# Patient Record
Sex: Female | Born: 1955 | Race: White | Hispanic: No | Marital: Married | Smoking: Former smoker
Health system: Southern US, Community
[De-identification: ages and names within clinical notes are randomized; demographics above are authoritative.]

## PROBLEM LIST (undated history)

## (undated) DIAGNOSIS — I471 Supraventricular tachycardia, unspecified: Secondary | ICD-10-CM

## (undated) DIAGNOSIS — I10 Essential (primary) hypertension: Secondary | ICD-10-CM

## (undated) DIAGNOSIS — E119 Type 2 diabetes mellitus without complications: Secondary | ICD-10-CM

## (undated) DIAGNOSIS — I639 Cerebral infarction, unspecified: Secondary | ICD-10-CM

## (undated) DIAGNOSIS — E7800 Pure hypercholesterolemia, unspecified: Secondary | ICD-10-CM

## (undated) HISTORY — PX: TRIGGER FINGER RELEASE: SHX641

## (undated) HISTORY — PX: CHOLECYSTECTOMY: SHX55

---

## 2007-05-16 ENCOUNTER — Inpatient Hospital Stay: Payer: Self-pay | Admitting: Internal Medicine

## 2007-05-16 ENCOUNTER — Other Ambulatory Visit: Payer: Self-pay

## 2007-05-21 ENCOUNTER — Ambulatory Visit: Payer: Self-pay | Admitting: Internal Medicine

## 2007-05-27 ENCOUNTER — Encounter: Payer: Self-pay | Admitting: Internal Medicine

## 2007-06-11 ENCOUNTER — Encounter: Payer: Self-pay | Admitting: Internal Medicine

## 2007-06-11 ENCOUNTER — Ambulatory Visit: Payer: Self-pay | Admitting: Internal Medicine

## 2007-07-12 ENCOUNTER — Encounter: Payer: Self-pay | Admitting: Internal Medicine

## 2008-06-23 ENCOUNTER — Ambulatory Visit: Payer: Self-pay | Admitting: Internal Medicine

## 2009-11-09 ENCOUNTER — Ambulatory Visit: Payer: Self-pay | Admitting: Internal Medicine

## 2011-01-10 ENCOUNTER — Ambulatory Visit: Payer: Self-pay

## 2013-12-08 ENCOUNTER — Ambulatory Visit: Payer: Self-pay | Admitting: Orthopedic Surgery

## 2014-06-21 ENCOUNTER — Ambulatory Visit: Payer: Self-pay | Admitting: Gastroenterology

## 2014-06-23 LAB — PATHOLOGY REPORT

## 2016-06-07 ENCOUNTER — Other Ambulatory Visit: Payer: Self-pay | Admitting: Obstetrics and Gynecology

## 2016-06-07 DIAGNOSIS — Z1231 Encounter for screening mammogram for malignant neoplasm of breast: Secondary | ICD-10-CM

## 2016-07-05 ENCOUNTER — Encounter: Payer: Self-pay | Admitting: Radiology

## 2016-07-05 ENCOUNTER — Ambulatory Visit
Admission: RE | Admit: 2016-07-05 | Discharge: 2016-07-05 | Disposition: A | Discharge status: Home / Self Care | Payer: Managed Care, Other (non HMO) | Source: Ambulatory Visit | Attending: Obstetrics and Gynecology | Admitting: Obstetrics and Gynecology

## 2016-07-05 DIAGNOSIS — Z1231 Encounter for screening mammogram for malignant neoplasm of breast: Secondary | ICD-10-CM | POA: Diagnosis not present

## 2017-07-24 ENCOUNTER — Other Ambulatory Visit: Payer: Self-pay | Admitting: Obstetrics and Gynecology

## 2017-07-24 DIAGNOSIS — Z1231 Encounter for screening mammogram for malignant neoplasm of breast: Secondary | ICD-10-CM

## 2017-08-22 ENCOUNTER — Ambulatory Visit
Admission: RE | Admit: 2017-08-22 | Discharge: 2017-08-22 | Disposition: A | Discharge status: Home / Self Care | Payer: Commercial Managed Care - PPO | Source: Ambulatory Visit | Attending: Obstetrics and Gynecology | Admitting: Obstetrics and Gynecology

## 2017-08-22 DIAGNOSIS — Z1231 Encounter for screening mammogram for malignant neoplasm of breast: Secondary | ICD-10-CM | POA: Insufficient documentation

## 2018-01-12 ENCOUNTER — Encounter: Payer: Self-pay | Admitting: Gynecology

## 2018-01-12 ENCOUNTER — Other Ambulatory Visit: Payer: Self-pay

## 2018-01-12 ENCOUNTER — Ambulatory Visit
Admission: EM | Admit: 2018-01-12 | Discharge: 2018-01-12 | Disposition: A | Discharge status: Home / Self Care | Payer: Commercial Managed Care - PPO | Attending: Family Medicine | Admitting: Family Medicine

## 2018-01-12 ENCOUNTER — Ambulatory Visit (INDEPENDENT_AMBULATORY_CARE_PROVIDER_SITE_OTHER): Payer: Commercial Managed Care - PPO

## 2018-01-12 DIAGNOSIS — M25532 Pain in left wrist: Secondary | ICD-10-CM | POA: Diagnosis not present

## 2018-01-12 HISTORY — DX: Essential (primary) hypertension: I10

## 2018-01-12 HISTORY — DX: Pure hypercholesterolemia, unspecified: E78.00

## 2018-01-12 HISTORY — DX: Type 2 diabetes mellitus without complications: E11.9

## 2018-01-12 MED ORDER — PREDNISONE 10 MG PO TABS: ORAL_TABLET | ORAL | 0 refills | Status: DC

## 2018-01-12 MED ORDER — OXYCODONE-ACETAMINOPHEN 5-325 MG PO TABS: 1.0000 | ORAL_TABLET | Freq: Four times a day (QID) | ORAL | 0 refills | Status: DC | PRN

## 2018-01-12 NOTE — Discharge Instructions (Signed)
Take medication as prescribed. Rest. Drink plenty of fluids.   Follow up with orthopedic this week as needed.  As discussed for any increased pain, pain radiation or decreased range of motion or worsening concerns proceed directly to the emergency department.

## 2018-01-12 NOTE — ED Triage Notes (Signed)
Patient c/o left hand pain x last pm. Per patient deny any injury to her left hand.

## 2018-01-12 NOTE — ED Provider Notes (Addendum)
[facility] ____________________________________________  Time seen: Approximately 1:27 PM  I have reviewed the triage vital signs and the nursing notes.   HISTORY  Chief Complaint Hand Pain   HPI Ximenna Fonseca is a 62 y.o. female presenting for evaluation of left wrist pain.  Patient reports pain started yesterday evening, and states it felt like a normal arthritis aching pain.  Reports pain has gradually increased, particularly over the last few hours today.  States pain is based out of the left wrist and does somewhat radiate to her hand as well as distal forearm.  Denies any further radiation of pain.  Denies any known trauma or direct injury.  Reports right-hand dominant.  Reports movement of left hand and wrist does increase pain, but pain is present as well at rest.  Reports that she does have some slight intermittent tingling sensation to left hand distal fingers that is been present since trigger finger release last year, but denies any sensation changes.  States she does still have full range of motion present but with a lot of pain to do so to left wrist.  Denies any skin changes, insect bite, swelling, loss of sensation, or other complaints.  Denies known triggers.  Denies history of same in the past.  Reports otherwise feels well.  Denies any chest pain, shortness of breath, paresthesias, headache, fevers, cough, congestion, recent sickness or other complaints.  Did take one Mobic earlier this morning, and feels like that may have started to help pain decrease some.  States moderate pain at this time.  Denies other relieving factors.  Follows with Dr Rudene Christians ortho  Past Medical History:  Diagnosis Date  . Diabetes mellitus without complication ([place])   . Hypercholesteremia   . Hypertension     There are no active problems to display for this patient.   Past Surgical History:  Procedure Laterality Date  . CHOLECYSTECTOMY    . TRIGGER FINGER RELEASE       No  current facility-administered medications for this encounter.   Current Outpatient Medications:  .  amLODipine (NORVASC) 5 MG tablet, Take by mouth., Disp: , Rfl:  .  atorvastatin (LIPITOR) 10 MG tablet, Take by mouth., Disp: , Rfl:  .  metFORMIN (GLUCOPHAGE) 500 MG tablet, Take by mouth., Disp: , Rfl:  .  Prasterone (INTRAROSA) 6.5 MG INST, Place vaginally., Disp: , Rfl:  .  enalapril (VASOTEC) 5 MG tablet, , Disp: , Rfl: 1 .  ibuprofen (ADVIL,MOTRIN) 200 MG tablet, Take 1-2 tablets by mouth three times a day as needed for pain., Disp: , Rfl:  .  INTRAROSA 6.5 MG INST, , Disp: , Rfl: 11 .  oxyCODONE-acetaminophen (PERCOCET/ROXICET) 5-325 MG tablet, Take 1 tablet by mouth every 6 (six) hours as needed for severe pain. Do not drive while taking as can cause drowsiness., Disp: 5 tablet, Rfl: 0 .  predniSONE (DELTASONE) 10 MG tablet, Start 60 mg po day one, then 50 mg po day two, taper by 10 mg daily until complete., Disp: 21 tablet, Rfl: 0  Allergies Codeine  Family History  Problem Relation Age of Onset  . Hypertension Mother   . Diabetes Mother   . Hypercholesterolemia Mother   . Heart failure Father     Social History Social History   Tobacco Use  . Smoking status: Former Research scientist (life sciences)  . Smokeless tobacco: Never Used  Substance Use Topics  . Alcohol use: Yes  . Drug use: Never    Review of Systems Constitutional: No fever/chills  Cardiovascular: Denies chest pain. Respiratory: Denies shortness of breath. Gastrointestinal: No abdominal pain.  Musculoskeletal: Negative for back pain. As above.  Skin: Negative for rash. Neurological: Negative for headaches, focal weakness or numbness.    ____________________________________________   PHYSICAL EXAM:  VITAL SIGNS: ED Triage Vitals  Enc Vitals Group     BP 01/12/18 1209 (!) 159/87     Pulse Rate 01/12/18 1209 100     Resp 01/12/18 1209 16     Temp 01/12/18 1209 98.7 F (37.1 C)     Temp Source 01/12/18 1209 Oral      SpO2 01/12/18 1209 98 %     Weight 01/12/18 1203 147 lb (66.7 kg)     Height 01/12/18 1203 5\' 6"  (1.676 m)     Head Circumference --      Peak Flow --      Pain Score 01/12/18 1203 7     Pain Loc --      Pain Edu? --      Excl. in [place]? --     Constitutional: Alert and oriented. Well appearing and in no acute distress. ENT      Head: Normocephalic and atraumatic. Cardiovascular: Normal rate, regular rhythm. Grossly normal heart sounds.  Good peripheral circulation. Respiratory: Normal respiratory effort without tachypnea nor retractions. Breath sounds are clear and equal bilaterally. No wheezes, rales, rhonchi. Musculoskeletal: No midline cervical, thoracic or lumbar tenderness to palpation. Bilateral distal radial and brachial pulses equal and easily palpated. Except: Left dorsal and volar wrist diffuse but localized in wrist moderate tenderness to direct palpation, with minimal tenderness to proximal metacarpals and distal forearm of left arm, no edema, no erythema, normal coloration, left hand normal distal sensation per patient, and brisk distal capillary refill, no motor or tendon deficit noted to left distal hand, weakened left hand grip compared to right, increased pain with left wrist flexion and extension as well as pronation and supination, left upper extremity otherwise nontender. Neurologic:  Normal speech and language. No gross focal neurologic deficits are appreciated. Speech is normal. No gait instability.  Skin:  Skin is warm, dry and intact. No rash noted. Psychiatric: Mood and affect are normal. Speech and behavior are normal. Patient exhibits appropriate insight and judgment   ___________________________________________   LABS (all labs ordered are listed, but only abnormal results are displayed)  Labs Reviewed - No data to display  RADIOLOGY  Dg Wrist Complete Left  Result Date: 01/12/2018 CLINICAL DATA:  Acute nontraumatic diffuse left wrist pain. EXAM: LEFT WRIST -  COMPLETE 3+ VIEW COMPARISON:  None. FINDINGS: There is no evidence of fracture or dislocation. Coarse calcifications within the radiocarpal joint may represent posttraumatic changes or CPPD arthropathy. Diffuse soft tissue swelling of the wrist noted. IMPRESSION: Posttraumatic changes versus CPPD arthropathy of the radiocarpal joint. Diffuse soft tissue swelling. No evidence of fracture or dislocation. Electronically Signed   By: Fidela Salisbury M.D.   On: 01/12/2018 13:38   ____________________________________________   PROCEDURES Procedures    INITIAL IMPRESSION / ASSESSMENT AND PLAN / ED COURSE  Pertinent labs & imaging results that were available during my care of the patient were reviewed by me and considered in my medical decision making (see chart for details).  Overall well-appearing patient.  Presented for evaluation of left wrist pain.  Denies known trauma.  Diffuse left wrist pain, normal distal sensation capillary refill, distal pulses intact.  No skin changes or edema.  Left wrist x-ray evaluated, results as above per radiologist.  Per radiologist left wrist post traumatic changes versus CPPD arthropathy, no fracture or dislocation.  Patient denies previous trauma to left wrist.  Suspect acute pseudogout.  Discussed will treat with oral prednisone, discussed monitoring blood sugars and PRN Percocet as needed for breakthrough pain.  Velcro cock-up splint given for support.  Discussed for any increased pain or worsening concerns, decreased range of motion or sensation changes proceed directly to the emergency room for further evaluation.  Follow-up with orthopedic this week.Discussed indication, risks and benefits of medications with patient.  Discussed follow up with Primary care physician this week. Discussed follow up and return parameters including no resolution or any worsening concerns. Patient verbalized understanding and agreed to plan.   Discussed patient and plan of care  with Dr. Zenda Alpers who agrees with plan.[place] controlled substance database reviewed and no recent controlled medications documented.  ____________________________________________   FINAL CLINICAL IMPRESSION(S) / ED DIAGNOSES  Final diagnoses:  Left wrist pain     ED Discharge Orders        Ordered    predniSONE (DELTASONE) 10 MG tablet     01/12/18 1401    oxyCODONE-acetaminophen (PERCOCET/ROXICET) 5-325 MG tablet  Every 6 hours PRN     01/12/18 1401       Note: This dictation was prepared with Dragon dictation along with smaller phrase technology. Any transcriptional errors that result from this process are unintentional.           Marylene Land, NP 01/12/18 1746

## 2018-03-28 ENCOUNTER — Encounter: Payer: Self-pay | Admitting: *Deleted

## 2018-03-31 ENCOUNTER — Ambulatory Visit: Payer: Commercial Managed Care - PPO | Admitting: Anesthesiology

## 2018-03-31 ENCOUNTER — Ambulatory Visit
Admission: RE | Admit: 2018-03-31 | Discharge: 2018-03-31 | Disposition: A | Discharge status: Home / Self Care | Payer: Commercial Managed Care - PPO | Source: Ambulatory Visit | Attending: Unknown Physician Specialty | Admitting: Unknown Physician Specialty

## 2018-03-31 ENCOUNTER — Encounter
Admission: RE | Disposition: A | Discharge status: Home / Self Care | Payer: Self-pay | Source: Ambulatory Visit | Attending: Unknown Physician Specialty

## 2018-03-31 ENCOUNTER — Encounter: Payer: Self-pay | Admitting: Anesthesiology

## 2018-03-31 DIAGNOSIS — I10 Essential (primary) hypertension: Secondary | ICD-10-CM | POA: Diagnosis not present

## 2018-03-31 DIAGNOSIS — E7800 Pure hypercholesterolemia, unspecified: Secondary | ICD-10-CM | POA: Diagnosis not present

## 2018-03-31 DIAGNOSIS — Z87891 Personal history of nicotine dependence: Secondary | ICD-10-CM | POA: Diagnosis not present

## 2018-03-31 DIAGNOSIS — Z79899 Other long term (current) drug therapy: Secondary | ICD-10-CM | POA: Diagnosis not present

## 2018-03-31 DIAGNOSIS — Z885 Allergy status to narcotic agent status: Secondary | ICD-10-CM | POA: Insufficient documentation

## 2018-03-31 DIAGNOSIS — I471 Supraventricular tachycardia: Secondary | ICD-10-CM | POA: Insufficient documentation

## 2018-03-31 DIAGNOSIS — Z8371 Family history of colonic polyps: Secondary | ICD-10-CM | POA: Diagnosis not present

## 2018-03-31 DIAGNOSIS — K640 First degree hemorrhoids: Secondary | ICD-10-CM | POA: Insufficient documentation

## 2018-03-31 DIAGNOSIS — Z7982 Long term (current) use of aspirin: Secondary | ICD-10-CM | POA: Insufficient documentation

## 2018-03-31 DIAGNOSIS — Z8673 Personal history of transient ischemic attack (TIA), and cerebral infarction without residual deficits: Secondary | ICD-10-CM | POA: Diagnosis not present

## 2018-03-31 DIAGNOSIS — Z86010 Personal history of colonic polyps: Secondary | ICD-10-CM | POA: Diagnosis not present

## 2018-03-31 DIAGNOSIS — E119 Type 2 diabetes mellitus without complications: Secondary | ICD-10-CM | POA: Diagnosis not present

## 2018-03-31 DIAGNOSIS — Z7984 Long term (current) use of oral hypoglycemic drugs: Secondary | ICD-10-CM | POA: Diagnosis not present

## 2018-03-31 DIAGNOSIS — D127 Benign neoplasm of rectosigmoid junction: Secondary | ICD-10-CM | POA: Diagnosis not present

## 2018-03-31 DIAGNOSIS — Z1211 Encounter for screening for malignant neoplasm of colon: Secondary | ICD-10-CM | POA: Insufficient documentation

## 2018-03-31 HISTORY — DX: Supraventricular tachycardia: I47.1

## 2018-03-31 HISTORY — DX: Cerebral infarction, unspecified: I63.9

## 2018-03-31 HISTORY — DX: Supraventricular tachycardia, unspecified: I47.10

## 2018-03-31 HISTORY — PX: COLONOSCOPY WITH PROPOFOL: SHX5780

## 2018-03-31 LAB — GLUCOSE, CAPILLARY: Glucose-Capillary: 141 mg/dL — ABNORMAL HIGH (ref 70–99)

## 2018-03-31 SURGERY — COLONOSCOPY WITH PROPOFOL
Anesthesia: General

## 2018-03-31 MED ORDER — PROPOFOL 500 MG/50ML IV EMUL
INTRAVENOUS | Status: DC | PRN
  Administered 2018-03-31: 110 ug/kg/min via INTRAVENOUS

## 2018-03-31 MED ORDER — SODIUM CHLORIDE 0.9 % IV SOLN
INTRAVENOUS | Status: DC
  Administered 2018-03-31: 11:00:00 via INTRAVENOUS

## 2018-03-31 MED ORDER — PROPOFOL 500 MG/50ML IV EMUL
INTRAVENOUS | Status: AC
  Filled 2018-03-31: qty 50

## 2018-03-31 MED ORDER — PROPOFOL 10 MG/ML IV BOLUS
INTRAVENOUS | Status: DC | PRN
  Administered 2018-03-31: 100 mg via INTRAVENOUS

## 2018-03-31 MED ORDER — SODIUM CHLORIDE 0.9 % IV SOLN: INTRAVENOUS | Status: DC

## 2018-03-31 NOTE — Transfer of Care (Signed)
Immediate Anesthesia Transfer of Care Note  Patient: Monica Nixon  Procedure(s) Performed: COLONOSCOPY WITH PROPOFOL (N/A )  Patient Location: PACU  Anesthesia Type:General  Level of Consciousness: awake, alert  and oriented  Airway & Oxygen Therapy: Patient Spontanous Breathing and Patient connected to nasal cannula oxygen  Post-op Assessment: Report given to RN and Post -op Vital signs reviewed and stable  Post vital signs: Reviewed and stable  Last Vitals:  Vitals Value Taken Time  BP 97/72 03/31/2018 12:10 PM  Temp 36.6 C 03/31/2018 12:10 PM  Pulse 86 03/31/2018 12:10 PM  Resp 10 03/31/2018 12:10 PM  SpO2 100 % 03/31/2018 12:10 PM    Last Pain:  Vitals:   03/31/18 1210  TempSrc: Tympanic  PainSc: 0-No pain         Complications: No apparent anesthesia complications

## 2018-03-31 NOTE — Op Note (Signed)
[facility] Gastroenterology Patient Name: Monica Nixon Procedure Date: 03/31/2018 11:37 AM MRN: [id number] Account #: [id number] Date of Birth: [birth year] Admit Type: Outpatient Age: 63 Room: [facility] ENDO ROOM 1 Gender: Female Note Status: Finalized Procedure:            Colonoscopy Indications:          Screening for colorectal malignant neoplasm Providers:            Manya Silvas, MD Referring MD:         No Local Md, MD (Referring MD) Medicines:            Propofol per Anesthesia Complications:        No immediate complications. Procedure:            Pre-Anesthesia Assessment:                       - After reviewing the risks and benefits, the patient                        was deemed in satisfactory condition to undergo the                        procedure.                       After obtaining informed consent, the colonoscope was                        passed under direct vision. Throughout the procedure,                        the patient's blood pressure, pulse, and oxygen                        saturations were monitored continuously. The                        Colonoscope was introduced through the anus and                        advanced to the the cecum, identified by appendiceal                        orifice and ileocecal valve. The colonoscopy was                        performed without difficulty. The patient tolerated the                        procedure well. The quality of the bowel preparation                        was good. Findings:      A fair amount of liquid stool had to be removed to get a good exam.      A small polyp was found in the recto-sigmoid colon. The polyp was       sessile. The polyp was removed with a hot snare. Resection and retrieval       were complete.      Internal hemorrhoids were found during endoscopy. The hemorrhoids were  small and Grade I (internal hemorrhoids that do not prolapse).      The exam was  otherwise without abnormality. Impression:           - One small polyp at the recto-sigmoid colon, removed                        with a hot snare. Resected and retrieved.                       - Internal hemorrhoids.                       - The examination was otherwise normal. Recommendation:       - Await pathology results. Manya Silvas, MD 03/31/2018 12:10:45 PM This report has been signed electronically. Number of Addenda: 0 Note Initiated On: 03/31/2018 11:37 AM Scope Withdrawal Time: 0 hours 15 minutes 38 seconds  Total Procedure Duration: 0 hours 23 minutes 53 seconds       [facility]

## 2018-03-31 NOTE — H&P (Signed)
Primary Care Physician:  System, Pcp Not In Primary Gastroenterologist:  Dr. Vira Agar  Pre-Procedure History & Physical: HPI:  Monica Nixon is a 62 y.o. female is here for an colonoscopy.  For Personal history of colon polyps and family history of colon polyps.   Past Medical History:  Diagnosis Date  . Diabetes mellitus without complication ([place])   . Hypercholesteremia   . Hypertension   . Stroke ([place])   . SVT (supraventricular tachycardia) ([facility])     Past Surgical History:  Procedure Laterality Date  . CHOLECYSTECTOMY    . TRIGGER FINGER RELEASE      Prior to Admission medications   Medication Sig Start Date End Date Taking? Authorizing Provider  amLODipine (NORVASC) 5 MG tablet Take by mouth. 07/24/17  Yes [provider]  aspirin 81 MG chewable tablet Chew by mouth daily.   Yes [provider]  atorvastatin (LIPITOR) 10 MG tablet Take by mouth. 11/14/17 11/14/18 Yes [provider]  enalapril (VASOTEC) 5 MG tablet  12/09/17  Yes [provider]  INTRAROSA 6.5 MG INST  12/10/17  Yes [provider]  metFORMIN (GLUCOPHAGE) 500 MG tablet Take by mouth. 11/14/17 11/14/18 Yes [provider]  Prasterone (INTRAROSA) 6.5 MG INST Place vaginally. 07/24/17  Yes [provider]  ibuprofen (ADVIL,MOTRIN) 200 MG tablet Take 1-2 tablets by mouth three times a day as needed for pain.    [provider]  oxyCODONE-acetaminophen (PERCOCET/ROXICET) 5-325 MG tablet Take 1 tablet by mouth every 6 (six) hours as needed for severe pain. Do not drive while taking as can cause drowsiness. Patient not taking: Reported on 03/31/2018 01/12/18   Marylene Land, NP  predniSONE (DELTASONE) 10 MG tablet Start 60 mg po day one, then 50 mg po day two, taper by 10 mg daily until complete. Patient not taking: Reported on 03/31/2018 01/12/18   Marylene Land, NP    Allergies as of 01/22/2018 - Review Complete 01/12/2018  Allergen Reaction Noted  .  Codeine Nausea And Vomiting 03/25/2014    Family History  Problem Relation Age of Onset  . Hypertension Mother   . Diabetes Mother   . Hypercholesterolemia Mother   . Heart failure Father     Social History   Socioeconomic History  . Marital status: Married    Spouse name: Not on file  . Number of children: Not on file  . Years of education: Not on file  . Highest education level: Not on file  Occupational History  . Not on file  Social Needs  . Financial resource strain: Not on file  . Food insecurity:    Worry: Not on file    Inability: Not on file  . Transportation needs:    Medical: Not on file    Non-medical: Not on file  Tobacco Use  . Smoking status: Former Research scientist (life sciences)  . Smokeless tobacco: Never Used  Substance and Sexual Activity  . Alcohol use: Yes  . Drug use: Never  . Sexual activity: Not on file  Lifestyle  . Physical activity:    Days per week: Not on file    Minutes per session: Not on file  . Stress: Not on file  Relationships  . Social connections:    Talks on phone: Not on file    Gets together: Not on file    Attends religious service: Not on file    Active member of club or organization: Not on file    Attends meetings of clubs or  organizations: Not on file    Relationship status: Not on file  . Intimate partner violence:    Fear of current or ex partner: Not on file    Emotionally abused: Not on file    Physically abused: Not on file    Forced sexual activity: Not on file  Other Topics Concern  . Not on file  Social History Narrative  . Not on file    Review of Systems: See HPI, otherwise negative ROS  Physical Exam: BP 120/85   Pulse (!) 117   Temp (!) 96 F (35.6 C) (Tympanic)   Resp 18   Ht 5\' 6"  (1.676 m)   Wt 63.5 kg (140 lb)   SpO2 98%   BMI 22.60 kg/m  General:   Alert,  pleasant and cooperative in NAD Head:  Normocephalic and atraumatic. Neck:  Supple; no masses or thyromegaly. Lungs:  Clear throughout to  auscultation.    Heart:  Regular rate and rhythm. Abdomen:  Soft, nontender and nondistended. Normal bowel sounds, without guarding, and without rebound.   Neurologic:  Alert and  oriented x4;  grossly normal neurologically.  Impression/Plan: Monica Nixon is here for an colonoscopy to be performed for Personal history of colon polyps and family history of colon polyps.  Risks, benefits, limitations, and alternatives regarding  colonoscopy have been reviewed with the patient.  Questions have been answered.  All parties agreeable.   Gaylyn Cheers, MD  03/31/2018, 11:34 AM

## 2018-03-31 NOTE — Anesthesia Preprocedure Evaluation (Addendum)
Anesthesia Evaluation  Patient identified by MRN, date of birth, ID band Patient awake    Reviewed: Allergy & Precautions, NPO status , Patient's Chart, lab work & pertinent test results  Airway Mallampati: III       Dental   Pulmonary former smoker,    Pulmonary exam normal        Cardiovascular hypertension, Normal cardiovascular exam+ dysrhythmias Supra Ventricular Tachycardia      Neuro/Psych CVA negative psych ROS   GI/Hepatic negative GI ROS, Neg liver ROS,   Endo/Other  diabetes  Renal/GU   negative genitourinary   Musculoskeletal negative musculoskeletal ROS (+)   Abdominal Normal abdominal exam  (+)   Peds negative pediatric ROS (+)  Hematology negative hematology ROS (+)   Anesthesia Other Findings   Reproductive/Obstetrics                            Anesthesia Physical Anesthesia Plan  ASA: III  Anesthesia Plan: General   Post-op Pain Management:    Induction: Intravenous  PONV Risk Score and Plan:   Airway Management Planned: Nasal Cannula  Additional Equipment:   Intra-op Plan:   Post-operative Plan:   Informed Consent: I have reviewed the patients History and Physical, chart, labs and discussed the procedure including the risks, benefits and alternatives for the proposed anesthesia with the patient or authorized representative who has indicated his/her understanding and acceptance.   Dental advisory given  Plan Discussed with: CRNA and Surgeon  Anesthesia Plan Comments:         Anesthesia Quick Evaluation

## 2018-03-31 NOTE — Anesthesia Post-op Follow-up Note (Signed)
Anesthesia QCDR form completed.        

## 2018-04-01 ENCOUNTER — Encounter: Payer: Self-pay | Admitting: Unknown Physician Specialty

## 2018-04-01 LAB — SURGICAL PATHOLOGY

## 2018-04-01 NOTE — Anesthesia Postprocedure Evaluation (Signed)
Anesthesia Post Note  Patient: Monica Nixon  Procedure(s) Performed: COLONOSCOPY WITH PROPOFOL (N/A )  Patient location during evaluation: PACU Anesthesia Type: General Level of consciousness: awake and alert and oriented Pain management: pain level controlled Vital Signs Assessment: post-procedure vital signs reviewed and stable Respiratory status: spontaneous breathing Cardiovascular status: blood pressure returned to baseline Anesthetic complications: no     Last Vitals:  Vitals:   03/31/18 1230 03/31/18 1240  BP: 124/68 134/70  Pulse: 77 70  Resp: 19 14  Temp:    SpO2: 100% 100%    Last Pain:  Vitals:   04/01/18 0741  TempSrc:   PainSc: 0-No pain                 [name]

## 2018-07-29 ENCOUNTER — Other Ambulatory Visit: Payer: Self-pay | Admitting: Obstetrics and Gynecology

## 2018-07-29 DIAGNOSIS — Z1231 Encounter for screening mammogram for malignant neoplasm of breast: Secondary | ICD-10-CM

## 2018-08-26 ENCOUNTER — Ambulatory Visit
Admission: RE | Admit: 2018-08-26 | Discharge: 2018-08-26 | Disposition: A | Discharge status: Home / Self Care | Payer: Commercial Managed Care - PPO | Source: Ambulatory Visit | Attending: Obstetrics and Gynecology | Admitting: Obstetrics and Gynecology

## 2018-08-26 DIAGNOSIS — Z1231 Encounter for screening mammogram for malignant neoplasm of breast: Secondary | ICD-10-CM | POA: Diagnosis not present

## 2018-09-10 DIAGNOSIS — E119 Type 2 diabetes mellitus without complications: Secondary | ICD-10-CM | POA: Diagnosis not present

## 2018-10-11 DIAGNOSIS — E119 Type 2 diabetes mellitus without complications: Secondary | ICD-10-CM | POA: Diagnosis not present

## 2018-11-09 DIAGNOSIS — E119 Type 2 diabetes mellitus without complications: Secondary | ICD-10-CM | POA: Diagnosis not present

## 2018-11-25 DIAGNOSIS — I10 Essential (primary) hypertension: Secondary | ICD-10-CM | POA: Diagnosis not present

## 2018-11-25 DIAGNOSIS — E7800 Pure hypercholesterolemia, unspecified: Secondary | ICD-10-CM | POA: Diagnosis not present

## 2018-11-25 DIAGNOSIS — R7309 Other abnormal glucose: Secondary | ICD-10-CM | POA: Diagnosis not present

## 2018-11-25 DIAGNOSIS — E119 Type 2 diabetes mellitus without complications: Secondary | ICD-10-CM | POA: Diagnosis not present

## 2018-11-25 DIAGNOSIS — Z79899 Other long term (current) drug therapy: Secondary | ICD-10-CM | POA: Diagnosis not present

## 2018-12-04 DIAGNOSIS — M25531 Pain in right wrist: Secondary | ICD-10-CM | POA: Diagnosis not present

## 2018-12-04 DIAGNOSIS — M9901 Segmental and somatic dysfunction of cervical region: Secondary | ICD-10-CM | POA: Diagnosis not present

## 2018-12-04 DIAGNOSIS — M5412 Radiculopathy, cervical region: Secondary | ICD-10-CM | POA: Diagnosis not present

## 2018-12-05 DIAGNOSIS — M25531 Pain in right wrist: Secondary | ICD-10-CM | POA: Diagnosis not present

## 2018-12-05 DIAGNOSIS — M9901 Segmental and somatic dysfunction of cervical region: Secondary | ICD-10-CM | POA: Diagnosis not present

## 2018-12-05 DIAGNOSIS — M5412 Radiculopathy, cervical region: Secondary | ICD-10-CM | POA: Diagnosis not present

## 2018-12-08 DIAGNOSIS — E118 Type 2 diabetes mellitus with unspecified complications: Secondary | ICD-10-CM | POA: Diagnosis not present

## 2018-12-08 DIAGNOSIS — Z0000 Encounter for general adult medical examination without abnormal findings: Secondary | ICD-10-CM | POA: Diagnosis not present

## 2018-12-08 DIAGNOSIS — I10 Essential (primary) hypertension: Secondary | ICD-10-CM | POA: Diagnosis not present

## 2018-12-10 DIAGNOSIS — E119 Type 2 diabetes mellitus without complications: Secondary | ICD-10-CM | POA: Diagnosis not present

## 2019-01-23 DIAGNOSIS — M65331 Trigger finger, right middle finger: Secondary | ICD-10-CM | POA: Diagnosis not present

## 2019-09-08 ENCOUNTER — Other Ambulatory Visit: Payer: Self-pay | Admitting: Obstetrics and Gynecology

## 2019-09-08 DIAGNOSIS — Z1231 Encounter for screening mammogram for malignant neoplasm of breast: Secondary | ICD-10-CM

## 2019-09-10 ENCOUNTER — Ambulatory Visit
Admission: RE | Admit: 2019-09-10 | Discharge: 2019-09-10 | Disposition: A | Discharge status: Home / Self Care | Source: Ambulatory Visit | Attending: Obstetrics and Gynecology | Admitting: Obstetrics and Gynecology

## 2019-09-10 DIAGNOSIS — Z1231 Encounter for screening mammogram for malignant neoplasm of breast: Secondary | ICD-10-CM | POA: Diagnosis not present

## 2020-12-13 ENCOUNTER — Other Ambulatory Visit: Payer: Self-pay | Admitting: Obstetrics and Gynecology

## 2020-12-13 DIAGNOSIS — Z1231 Encounter for screening mammogram for malignant neoplasm of breast: Secondary | ICD-10-CM

## 2021-01-04 ENCOUNTER — Ambulatory Visit
Admission: RE | Admit: 2021-01-04 | Discharge: 2021-01-04 | Disposition: A | Discharge status: Home / Self Care | Source: Ambulatory Visit | Attending: Obstetrics and Gynecology | Admitting: Obstetrics and Gynecology

## 2021-01-04 ENCOUNTER — Other Ambulatory Visit: Payer: Self-pay

## 2021-01-04 DIAGNOSIS — Z1231 Encounter for screening mammogram for malignant neoplasm of breast: Secondary | ICD-10-CM | POA: Diagnosis present

## 2021-12-27 IMAGING — MG MM DIGITAL SCREENING BILAT W/ TOMO AND CAD
6 of 10 series · 6 of 30 positions shown · non-contrast
Comparison: Previous exam(s).

CLINICAL DATA: Screening.

EXAM:
DIGITAL SCREENING BILATERAL MAMMOGRAM WITH TOMOSYNTHESIS AND CAD
TECHNIQUE: Bilateral screening digital craniocaudal and mediolateral oblique
mammograms were obtained. Bilateral screening digital breast
tomosynthesis was performed. The images were evaluated with
computer-aided detection.

[L CC synth-2D]
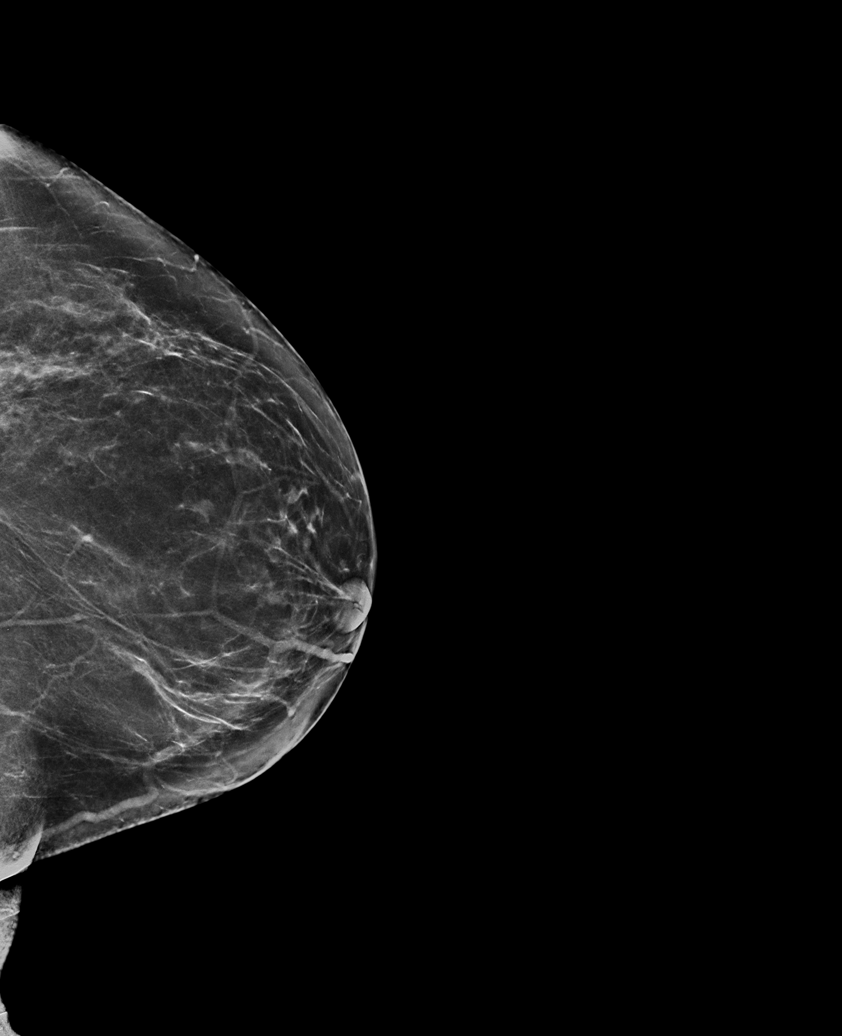

[L MLO synth-2D (1 of 2)]
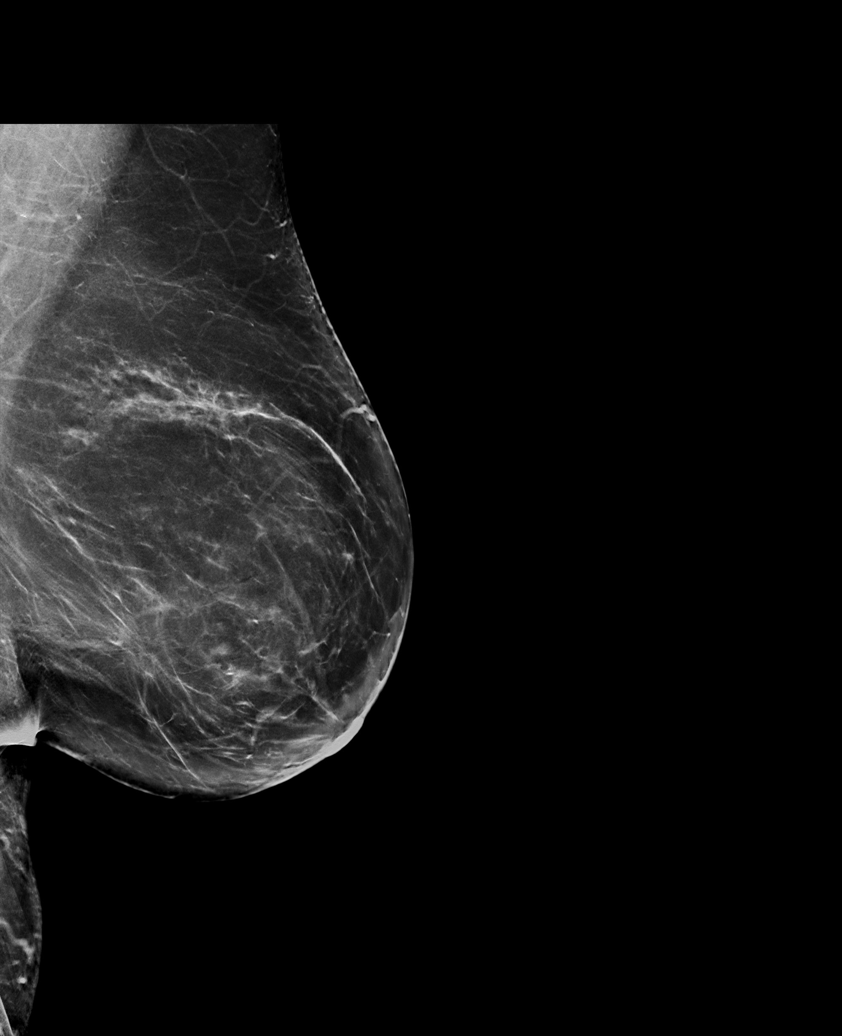

[R MLO synth-2D]
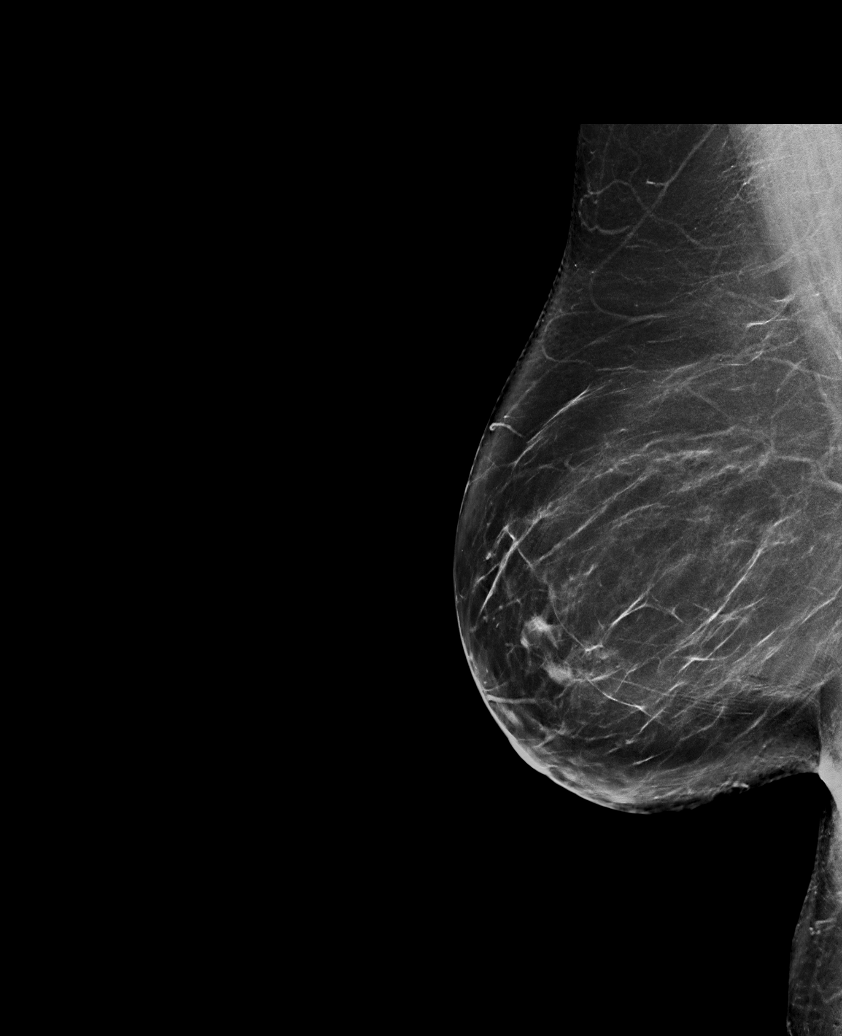

[L MLO synth-2D (2 of 2)]
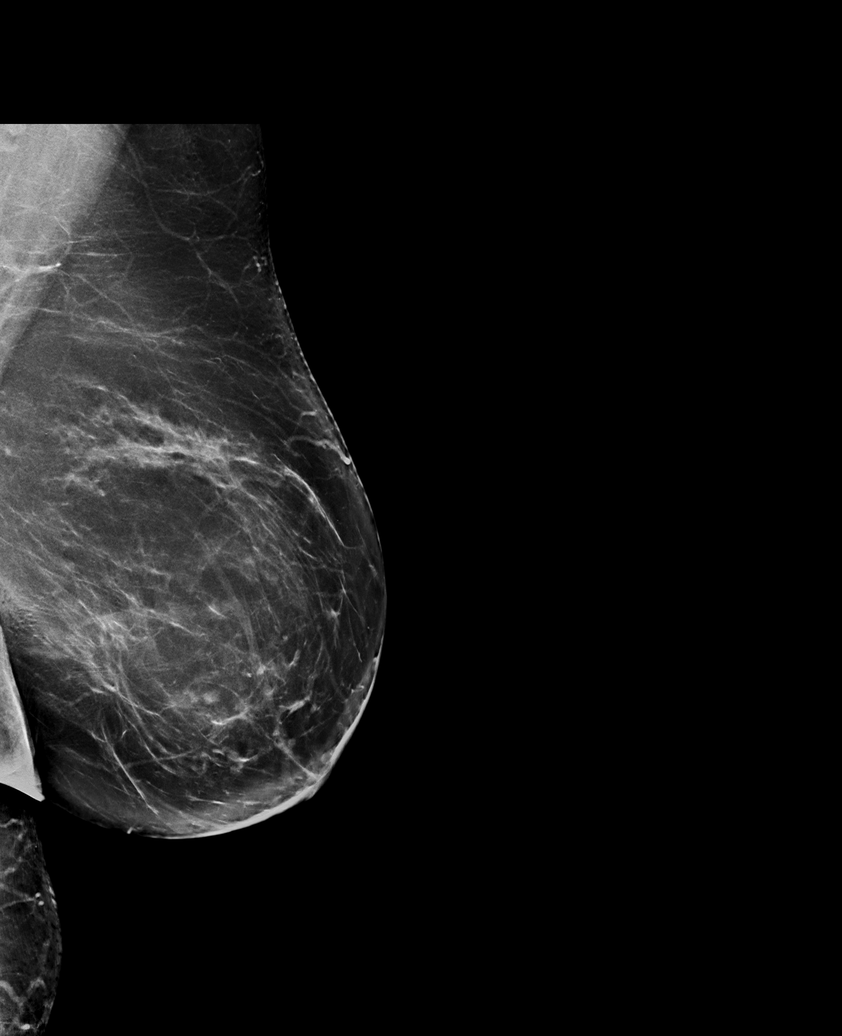

[R CC synth-2D]
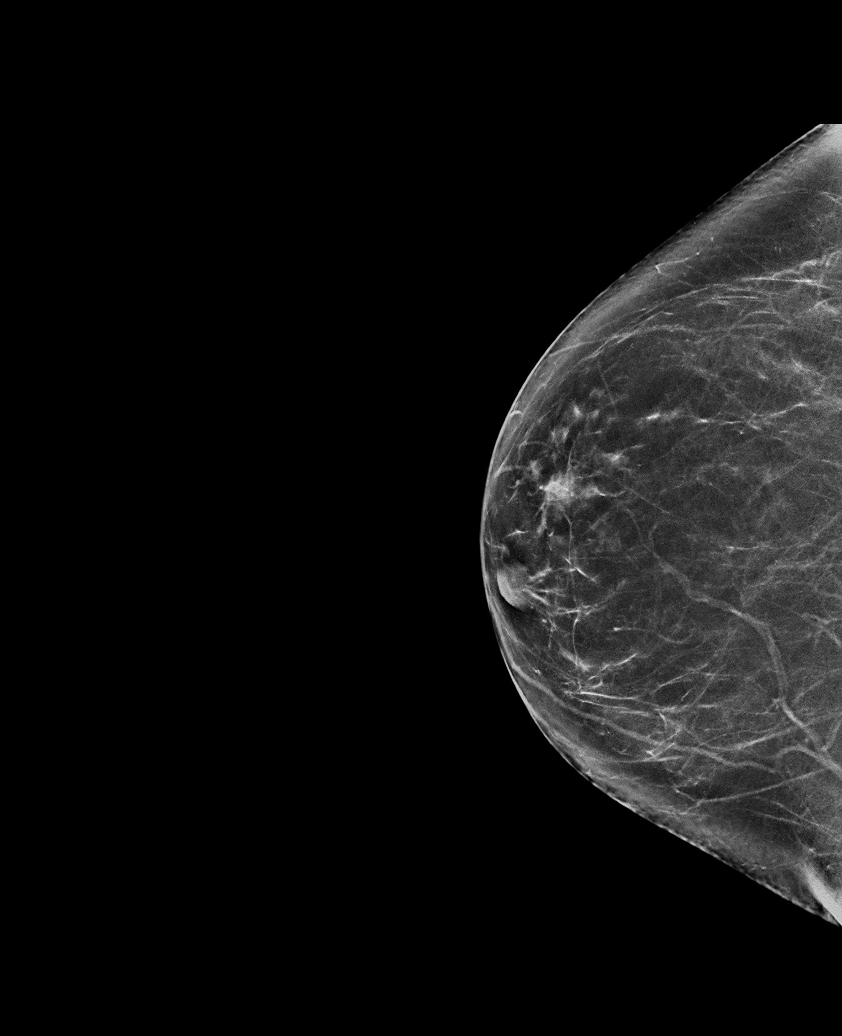

[R MLO tomo · tomo slice 43/86.0]
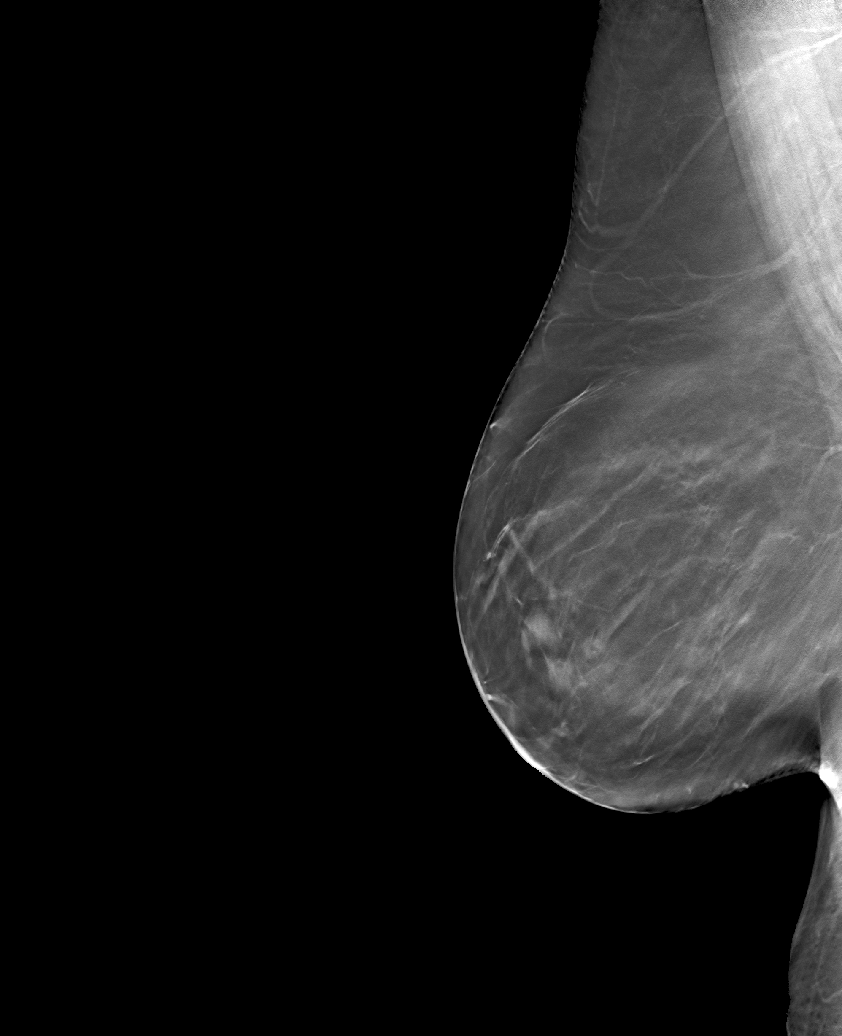

[6 of 30 positions shown; findings below may reference images not displayed]

ACR Breast Density Category b: There are scattered areas of
fibroglandular density.
FINDINGS: There are no findings suspicious for malignancy. The images were
evaluated with computer-aided detection.
IMPRESSION: No mammographic evidence of malignancy. A result letter of this
screening mammogram will be mailed directly to the patient.

RECOMMENDATION:
Screening mammogram in one year. (Code:WJ-I-BG6)

BI-RADS CATEGORY  1: Negative.

## 2022-05-18 ENCOUNTER — Other Ambulatory Visit: Payer: Self-pay | Admitting: Internal Medicine

## 2022-05-18 DIAGNOSIS — Z1231 Encounter for screening mammogram for malignant neoplasm of breast: Secondary | ICD-10-CM

## 2022-11-14 ENCOUNTER — Ambulatory Visit
Admission: RE | Admit: 2022-11-14 | Discharge: 2022-11-14 | Disposition: A | Discharge status: Home / Self Care | Source: Ambulatory Visit | Attending: Internal Medicine | Admitting: Internal Medicine

## 2022-11-14 DIAGNOSIS — Z1231 Encounter for screening mammogram for malignant neoplasm of breast: Secondary | ICD-10-CM | POA: Insufficient documentation

## 2023-02-14 ENCOUNTER — Emergency Department

## 2023-02-14 ENCOUNTER — Emergency Department
Admission: EM | Admit: 2023-02-14 | Discharge: 2023-02-14 | Disposition: A | Discharge status: Home / Self Care | Attending: Emergency Medicine | Admitting: Emergency Medicine

## 2023-02-14 ENCOUNTER — Other Ambulatory Visit: Payer: Self-pay

## 2023-02-14 ENCOUNTER — Encounter: Payer: Self-pay | Admitting: Emergency Medicine

## 2023-02-14 DIAGNOSIS — I10 Essential (primary) hypertension: Secondary | ICD-10-CM | POA: Insufficient documentation

## 2023-02-14 DIAGNOSIS — S82031A Displaced transverse fracture of right patella, initial encounter for closed fracture: Secondary | ICD-10-CM | POA: Insufficient documentation

## 2023-02-14 DIAGNOSIS — Z23 Encounter for immunization: Secondary | ICD-10-CM | POA: Diagnosis not present

## 2023-02-14 DIAGNOSIS — S8991XA Unspecified injury of right lower leg, initial encounter: Secondary | ICD-10-CM | POA: Diagnosis present

## 2023-02-14 DIAGNOSIS — E119 Type 2 diabetes mellitus without complications: Secondary | ICD-10-CM | POA: Insufficient documentation

## 2023-02-14 DIAGNOSIS — S82001A Unspecified fracture of right patella, initial encounter for closed fracture: Secondary | ICD-10-CM

## 2023-02-14 DIAGNOSIS — W19XXXA Unspecified fall, initial encounter: Secondary | ICD-10-CM | POA: Insufficient documentation

## 2023-02-14 MED ORDER — OXYCODONE-ACETAMINOPHEN 5-325 MG PO TABS: 1.0000 | ORAL_TABLET | Freq: Four times a day (QID) | ORAL | 0 refills | Status: DC | PRN

## 2023-02-14 MED ORDER — ONDANSETRON 4 MG PO TBDP: 4.0000 mg | ORAL_TABLET | Freq: Three times a day (TID) | ORAL | 0 refills | Status: DC | PRN

## 2023-02-14 MED ORDER — MORPHINE SULFATE (PF) 2 MG/ML IV SOLN
2.0000 mg | Freq: Once | INTRAVENOUS | Status: AC
  Administered 2023-02-14: 2 mg via INTRAMUSCULAR
  Filled 2023-02-14: qty 1

## 2023-02-14 MED ORDER — ONDANSETRON 4 MG PO TBDP
4.0000 mg | ORAL_TABLET | Freq: Once | ORAL | Status: AC
  Administered 2023-02-14: 4 mg via ORAL
  Filled 2023-02-14: qty 1

## 2023-02-14 MED ORDER — TETANUS-DIPHTH-ACELL PERTUSSIS 5-2.5-18.5 LF-MCG/0.5 IM SUSY
0.5000 mL | PREFILLED_SYRINGE | Freq: Once | INTRAMUSCULAR | Status: AC
  Administered 2023-02-14: 0.5 mL via INTRAMUSCULAR
  Filled 2023-02-14: qty 0.5

## 2023-02-14 NOTE — ED Provider Notes (Signed)
[facility] Provider Note    Event Date/Time   First MD Initiated Contact with Patient 02/14/23 1237     (approximate)   History   Knee Pain   HPI  Monica Nixon is a 67 y.o. female   presents to the ED via EMS from home with history of a fall that occurred yesterday.  Patient states she landed on her knee.  She denies any head injury or loss of consciousness.  This morning her knee is more swollen than yesterday.  Patient has history of hypertension, SVT, diabetes, CVA.      Physical Exam   Triage Vital Signs: ED Triage Vitals  Enc Vitals Group     BP 02/14/23 1226 (!) 163/84     Pulse Rate 02/14/23 1226 94     Resp 02/14/23 1226 18     Temp 02/14/23 1226 98 F (36.7 C)     Temp src --      SpO2 02/14/23 1226 100 %     Weight --      Height --      Head Circumference --      Peak Flow --      Pain Score 02/14/23 1223 7     Pain Loc --      Pain Edu? --      Excl. in GC? --     Most recent vital signs: Vitals:   02/14/23 1226 02/14/23 1600  BP: (!) 163/84 (!) 154/80  Pulse: 94 88  Resp: 18 18  Temp: 98 F (36.7 C)   SpO2: 100% 100%     General: Awake, no distress.  Alert, talkative, interactive.  Answers questions appropriately. CV:  Good peripheral perfusion.  Resp:  Normal effort.  Abd:  No distention.  Other:  Right knee with no gross deformity.  Moderate soft tissue edema and marked tenderness on palpation anteriorly.  Effusion noted on exam.  Pulses present distally.   ED Results / Procedures / Treatments   Labs (all labs ordered are listed, but only abnormal results are displayed) Labs Reviewed - No data to display    RADIOLOGY X-ray images of the right knee were reviewed and interpreted by myself independent of the radiologist and a patella fracture was noted.   PROCEDURES:  Critical Care performed:   Procedures   MEDICATIONS ORDERED IN ED: Medications  morphine (PF) 2 MG/ML injection 2 mg (2 mg  Intramuscular Given 02/14/23 1525)  ondansetron (ZOFRAN-ODT) disintegrating tablet 4 mg (4 mg Oral Given 02/14/23 1524)  Tdap (BOOSTRIX) injection 0.5 mL (0.5 mLs Intramuscular Given 02/14/23 1525)     IMPRESSION / MDM / ASSESSMENT AND PLAN / ED COURSE  I reviewed the triage vital signs and the nursing notes.   Differential diagnosis includes, but is not limited to, fracture right knee, dislocation, sprain, contusion secondary to fall.  67 year old female presents to the ED with complaint of right knee pain after a fall that occurred yesterday.  X-rays revealed a transverse fracture of the right patella and patient was made aware.  Dr. Audelia Acton is the orthopedist on-call.  He was consulted and advised that placing the patient in a knee immobilizer with crutches/walker would be appropriate at this time and he will follow her up in the office on Tuesday.  Patient was given morphine 2 mg IM and Zofran ODT p.o. while in the ED.  We discussed pain medication at home and patient is aware that this could cause drowsiness and increase  her risk for falling.  A prescription for Percocet was sent to the pharmacy as she has taken this in the past.  Ice and elevation until she is seen by the orthopedist.      Patient's presentation is most consistent with acute complicated illness / injury requiring diagnostic workup.  FINAL CLINICAL IMPRESSION(S) / ED DIAGNOSES   Final diagnoses:  Closed displaced fracture of right patella, unspecified fracture morphology, initial encounter  Fall, initial encounter     Rx / DC Orders   ED Discharge Orders          Ordered    oxyCODONE-acetaminophen (PERCOCET/ROXICET) 5-325 MG tablet  Every 6 hours PRN        02/14/23 1508    ondansetron (ZOFRAN-ODT) 4 MG disintegrating tablet  Every 8 hours PRN        02/14/23 1603             Note:  This document was prepared using Dragon voice recognition software and may include unintentional dictation errors.    Tommi Rumps, PA-C 02/14/23 1625    Minna Antis, MD 02/14/23 [phone]

## 2023-02-14 NOTE — ED Triage Notes (Signed)
Pt comes via EMs from home with c/o right knee pain. Pt states she fell yesterday and hurt her knee cap. Pt states 7-10 pain.

## 2023-02-14 NOTE — Discharge Instructions (Signed)
Call Dr. Ellene Route office and make an appointment for Tuesday.  Ice and elevation to reduce swelling.  Wear the knee immobilizer for protection and support.  Use walker for added support and protection.  The pain medication that was sent to the pharmacy is 1 that you have taken in the past.  Do not drive or operate machinery while taking this medication and eat before taking it.

## 2023-05-27 ENCOUNTER — Ambulatory Visit: Payer: Medicare Other

## 2023-05-27 DIAGNOSIS — Z09 Encounter for follow-up examination after completed treatment for conditions other than malignant neoplasm: Secondary | ICD-10-CM | POA: Diagnosis not present

## 2023-05-27 DIAGNOSIS — Z86010 Personal history of colonic polyps: Secondary | ICD-10-CM | POA: Diagnosis not present

## 2023-08-13 DIAGNOSIS — Z8673 Personal history of transient ischemic attack (TIA), and cerebral infarction without residual deficits: Secondary | ICD-10-CM | POA: Insufficient documentation

## 2023-10-30 ENCOUNTER — Other Ambulatory Visit: Payer: Self-pay | Admitting: Obstetrics and Gynecology

## 2023-10-30 DIAGNOSIS — Z1231 Encounter for screening mammogram for malignant neoplasm of breast: Secondary | ICD-10-CM

## 2023-11-15 ENCOUNTER — Ambulatory Visit
Admission: RE | Admit: 2023-11-15 | Discharge: 2023-11-15 | Disposition: A | Discharge status: Home / Self Care | Source: Ambulatory Visit | Attending: Obstetrics and Gynecology | Admitting: Obstetrics and Gynecology

## 2023-11-15 DIAGNOSIS — Z1231 Encounter for screening mammogram for malignant neoplasm of breast: Secondary | ICD-10-CM | POA: Insufficient documentation

## 2024-03-02 ENCOUNTER — Encounter
Admission: EM | Disposition: A | Discharge status: Skilled Nursing Facility | Payer: Self-pay | Source: Home / Self Care | Attending: Internal Medicine

## 2024-03-02 ENCOUNTER — Inpatient Hospital Stay

## 2024-03-02 ENCOUNTER — Inpatient Hospital Stay: Admitting: Anesthesiology

## 2024-03-02 ENCOUNTER — Inpatient Hospital Stay
Admission: EM | Admit: 2024-03-02 | Discharge: 2024-03-05 | DRG: 481 | Disposition: A | Discharge status: Skilled Nursing Facility | Attending: Internal Medicine | Admitting: Internal Medicine

## 2024-03-02 ENCOUNTER — Emergency Department

## 2024-03-02 ENCOUNTER — Encounter: Payer: Self-pay | Admitting: Internal Medicine

## 2024-03-02 ENCOUNTER — Other Ambulatory Visit: Payer: Self-pay

## 2024-03-02 DIAGNOSIS — S72001S Fracture of unspecified part of neck of right femur, sequela: Secondary | ICD-10-CM

## 2024-03-02 DIAGNOSIS — E785 Hyperlipidemia, unspecified: Secondary | ICD-10-CM

## 2024-03-02 DIAGNOSIS — S72001A Fracture of unspecified part of neck of right femur, initial encounter for closed fracture: Secondary | ICD-10-CM | POA: Diagnosis not present

## 2024-03-02 DIAGNOSIS — Z87891 Personal history of nicotine dependence: Secondary | ICD-10-CM

## 2024-03-02 DIAGNOSIS — Z8249 Family history of ischemic heart disease and other diseases of the circulatory system: Secondary | ICD-10-CM | POA: Diagnosis not present

## 2024-03-02 DIAGNOSIS — D62 Acute posthemorrhagic anemia: Secondary | ICD-10-CM | POA: Diagnosis not present

## 2024-03-02 DIAGNOSIS — Z7984 Long term (current) use of oral hypoglycemic drugs: Secondary | ICD-10-CM | POA: Diagnosis not present

## 2024-03-02 DIAGNOSIS — W109XXA Fall (on) (from) unspecified stairs and steps, initial encounter: Secondary | ICD-10-CM | POA: Diagnosis present

## 2024-03-02 DIAGNOSIS — Z7982 Long term (current) use of aspirin: Secondary | ICD-10-CM

## 2024-03-02 DIAGNOSIS — R112 Nausea with vomiting, unspecified: Secondary | ICD-10-CM | POA: Diagnosis not present

## 2024-03-02 DIAGNOSIS — Z7985 Long-term (current) use of injectable non-insulin antidiabetic drugs: Secondary | ICD-10-CM | POA: Diagnosis not present

## 2024-03-02 DIAGNOSIS — Z79899 Other long term (current) drug therapy: Secondary | ICD-10-CM | POA: Diagnosis not present

## 2024-03-02 DIAGNOSIS — I129 Hypertensive chronic kidney disease with stage 1 through stage 4 chronic kidney disease, or unspecified chronic kidney disease: Secondary | ICD-10-CM | POA: Diagnosis present

## 2024-03-02 DIAGNOSIS — S72141A Displaced intertrochanteric fracture of right femur, initial encounter for closed fracture: Principal | ICD-10-CM | POA: Diagnosis present

## 2024-03-02 DIAGNOSIS — Z8673 Personal history of transient ischemic attack (TIA), and cerebral infarction without residual deficits: Secondary | ICD-10-CM

## 2024-03-02 DIAGNOSIS — E1122 Type 2 diabetes mellitus with diabetic chronic kidney disease: Secondary | ICD-10-CM | POA: Diagnosis present

## 2024-03-02 DIAGNOSIS — D649 Anemia, unspecified: Secondary | ICD-10-CM | POA: Insufficient documentation

## 2024-03-02 DIAGNOSIS — N1831 Chronic kidney disease, stage 3a: Secondary | ICD-10-CM | POA: Diagnosis present

## 2024-03-02 DIAGNOSIS — E7800 Pure hypercholesterolemia, unspecified: Secondary | ICD-10-CM | POA: Diagnosis present

## 2024-03-02 DIAGNOSIS — D72829 Elevated white blood cell count, unspecified: Secondary | ICD-10-CM | POA: Diagnosis not present

## 2024-03-02 DIAGNOSIS — W19XXXA Unspecified fall, initial encounter: Secondary | ICD-10-CM

## 2024-03-02 DIAGNOSIS — I10 Essential (primary) hypertension: Secondary | ICD-10-CM | POA: Diagnosis not present

## 2024-03-02 DIAGNOSIS — M25551 Pain in right hip: Secondary | ICD-10-CM | POA: Diagnosis present

## 2024-03-02 HISTORY — PX: INTRAMEDULLARY (IM) NAIL INTERTROCHANTERIC: SHX5875

## 2024-03-02 LAB — CBC WITH DIFFERENTIAL/PLATELET
Abs Immature Granulocytes: 0.04 10*3/uL (ref 0.00–0.07)
Basophils Absolute: 0.1 10*3/uL (ref 0.0–0.1)
Basophils Relative: 1 %
Eosinophils Absolute: 0.1 10*3/uL (ref 0.0–0.5)
Eosinophils Relative: 0 %
HCT: 37.8 % (ref 36.0–46.0)
Hemoglobin: 12.7 g/dL (ref 12.0–15.0)
Immature Granulocytes: 0 %
Lymphocytes Relative: 10 %
Lymphs Abs: 1.2 10*3/uL (ref 0.7–4.0)
MCH: 30 pg (ref 26.0–34.0)
MCHC: 33.6 g/dL (ref 30.0–36.0)
MCV: 89.2 fL (ref 80.0–100.0)
Monocytes Absolute: 0.5 10*3/uL (ref 0.1–1.0)
Monocytes Relative: 4 %
Neutro Abs: 10.1 10*3/uL — ABNORMAL HIGH (ref 1.7–7.7)
Neutrophils Relative %: 85 %
Platelets: 225 10*3/uL (ref 150–400)
RBC: 4.24 MIL/uL (ref 3.87–5.11)
RDW: 12.4 % (ref 11.5–15.5)
WBC: 12 10*3/uL — ABNORMAL HIGH (ref 4.0–10.5)
nRBC: 0 % (ref 0.0–0.2)

## 2024-03-02 LAB — COMPREHENSIVE METABOLIC PANEL WITH GFR
ALT: 29 U/L (ref 0–44)
AST: 31 U/L (ref 15–41)
Albumin: 3.8 g/dL (ref 3.5–5.0)
Alkaline Phosphatase: 49 U/L (ref 38–126)
Anion gap: 5 (ref 5–15)
BUN: 18 mg/dL (ref 8–23)
CO2: 26 mmol/L (ref 22–32)
Calcium: 8.9 mg/dL (ref 8.9–10.3)
Chloride: 106 mmol/L (ref 98–111)
Creatinine, Ser: 1.19 mg/dL — ABNORMAL HIGH (ref 0.44–1.00)
GFR, Estimated: 51 mL/min — ABNORMAL LOW (ref >60)
Glucose, Bld: 191 mg/dL — ABNORMAL HIGH (ref 70–99)
Potassium: 4 mmol/L (ref 3.5–5.1)
Sodium: 137 mmol/L (ref 135–145)
Total Bilirubin: 1 mg/dL (ref 0.0–1.2)
Total Protein: 6.6 g/dL (ref 6.5–8.1)

## 2024-03-02 LAB — SURGICAL PCR SCREEN
MRSA, PCR: NEGATIVE
Staphylococcus aureus: NEGATIVE

## 2024-03-02 LAB — TYPE AND SCREEN
ABO/RH(D): O POS
Antibody Screen: NEGATIVE

## 2024-03-02 LAB — ABO/RH: ABO/RH(D): O POS

## 2024-03-02 LAB — GLUCOSE, CAPILLARY
Glucose-Capillary: 177 mg/dL — ABNORMAL HIGH (ref 70–99)
Glucose-Capillary: 154 mg/dL — ABNORMAL HIGH (ref 70–99)
Glucose-Capillary: 199 mg/dL — ABNORMAL HIGH (ref 70–99)

## 2024-03-02 LAB — HEMOGLOBIN A1C
Hgb A1c MFr Bld: 5.6 % (ref 4.8–5.6)
Mean Plasma Glucose: 114.02 mg/dL

## 2024-03-02 SURGERY — FIXATION, FRACTURE, INTERTROCHANTERIC, WITH INTRAMEDULLARY ROD
Anesthesia: General | Laterality: Right

## 2024-03-02 MED ORDER — DEXAMETHASONE SODIUM PHOSPHATE 10 MG/ML IJ SOLN
INTRAMUSCULAR | Status: DC | PRN
  Administered 2024-03-02: 10 mg via INTRAVENOUS

## 2024-03-02 MED ORDER — 0.9 % SODIUM CHLORIDE (POUR BTL) OPTIME
TOPICAL | Status: DC | PRN
  Administered 2024-03-02: 1000 mL

## 2024-03-02 MED ORDER — CHLORHEXIDINE GLUCONATE CLOTH 2 % EX PADS
6.0000 | MEDICATED_PAD | Freq: Every day | CUTANEOUS | Status: DC
  Administered 2024-03-02 – 2024-03-05 (×3): 6 via TOPICAL

## 2024-03-02 MED ORDER — ONDANSETRON HCL 4 MG/2ML IJ SOLN
4.0000 mg | Freq: Once | INTRAMUSCULAR | Status: AC
  Administered 2024-03-02: 4 mg via INTRAVENOUS
  Filled 2024-03-02: qty 2

## 2024-03-02 MED ORDER — LACTATED RINGERS IV SOLN: INTRAVENOUS | Status: DC

## 2024-03-02 MED ORDER — SUCCINYLCHOLINE CHLORIDE 200 MG/10ML IV SOSY
PREFILLED_SYRINGE | INTRAVENOUS | Status: AC
  Filled 2024-03-02: qty 10

## 2024-03-02 MED ORDER — ACETAMINOPHEN 325 MG PO TABS: 650.0000 mg | ORAL_TABLET | Freq: Four times a day (QID) | ORAL | Status: DC | PRN

## 2024-03-02 MED ORDER — LOSARTAN POTASSIUM 50 MG PO TABS
100.0000 mg | ORAL_TABLET | Freq: Every day | ORAL | Status: DC
  Administered 2024-03-03 – 2024-03-05 (×3): 100 mg via ORAL
  Filled 2024-03-02 (×3): qty 2

## 2024-03-02 MED ORDER — ACETAMINOPHEN 650 MG RE SUPP: 650.0000 mg | Freq: Four times a day (QID) | RECTAL | Status: DC | PRN

## 2024-03-02 MED ORDER — FENTANYL CITRATE (PF) 100 MCG/2ML IJ SOLN
INTRAMUSCULAR | Status: AC
  Filled 2024-03-02: qty 2

## 2024-03-02 MED ORDER — ONDANSETRON HCL 4 MG/2ML IJ SOLN
4.0000 mg | Freq: Four times a day (QID) | INTRAMUSCULAR | Status: DC | PRN
  Filled 2024-03-02: qty 2

## 2024-03-02 MED ORDER — ACETAMINOPHEN 10 MG/ML IV SOLN: 1000.0000 mg | Freq: Once | INTRAVENOUS | Status: DC | PRN

## 2024-03-02 MED ORDER — INSULIN ASPART 100 UNIT/ML IJ SOLN: 0.0000 [IU] | Freq: Every day | INTRAMUSCULAR | Status: DC

## 2024-03-02 MED ORDER — PROPOFOL 1000 MG/100ML IV EMUL
INTRAVENOUS | Status: AC
  Filled 2024-03-02: qty 100

## 2024-03-02 MED ORDER — OXYCODONE HCL 5 MG/5ML PO SOLN: 5.0000 mg | Freq: Once | ORAL | Status: DC | PRN

## 2024-03-02 MED ORDER — OXYCODONE HCL 5 MG PO TABS: 5.0000 mg | ORAL_TABLET | ORAL | Status: DC | PRN

## 2024-03-02 MED ORDER — SUCCINYLCHOLINE CHLORIDE 200 MG/10ML IV SOSY
PREFILLED_SYRINGE | INTRAVENOUS | Status: DC | PRN
  Administered 2024-03-02: 100 mg via INTRAVENOUS

## 2024-03-02 MED ORDER — MIDAZOLAM HCL 5 MG/5ML IJ SOLN
INTRAMUSCULAR | Status: DC | PRN
  Administered 2024-03-02: 2 mg via INTRAVENOUS

## 2024-03-02 MED ORDER — ACETAMINOPHEN 500 MG PO TABS
1000.0000 mg | ORAL_TABLET | Freq: Four times a day (QID) | ORAL | Status: AC
  Administered 2024-03-02 – 2024-03-03 (×4): 1000 mg via ORAL
  Filled 2024-03-02 (×4): qty 2

## 2024-03-02 MED ORDER — FENTANYL CITRATE PF 50 MCG/ML IJ SOSY
100.0000 ug | PREFILLED_SYRINGE | Freq: Once | INTRAMUSCULAR | Status: AC
  Administered 2024-03-02: 100 ug via INTRAVENOUS
  Filled 2024-03-02: qty 2

## 2024-03-02 MED ORDER — CEFAZOLIN SODIUM-DEXTROSE 2-4 GM/100ML-% IV SOLN
2.0000 g | Freq: Four times a day (QID) | INTRAVENOUS | Status: AC
  Administered 2024-03-02 – 2024-03-03 (×2): 2 g via INTRAVENOUS
  Filled 2024-03-02 (×2): qty 100

## 2024-03-02 MED ORDER — ACETAMINOPHEN 10 MG/ML IV SOLN
INTRAVENOUS | Status: DC | PRN
  Administered 2024-03-02: 1000 mg via INTRAVENOUS

## 2024-03-02 MED ORDER — DEXAMETHASONE SODIUM PHOSPHATE 10 MG/ML IJ SOLN
INTRAMUSCULAR | Status: AC
  Filled 2024-03-02: qty 1

## 2024-03-02 MED ORDER — CEFAZOLIN SODIUM-DEXTROSE 2-3 GM-%(50ML) IV SOLR
INTRAVENOUS | Status: DC | PRN
Start: 2024-03-02 — End: 2024-03-02
  Administered 2024-03-02: 2 g via INTRAVENOUS

## 2024-03-02 MED ORDER — METOCLOPRAMIDE HCL 5 MG PO TABS: 5.0000 mg | ORAL_TABLET | Freq: Three times a day (TID) | ORAL | Status: DC | PRN

## 2024-03-02 MED ORDER — SODIUM CHLORIDE 0.9 % IV SOLN: INTRAVENOUS | Status: DC

## 2024-03-02 MED ORDER — LIDOCAINE HCL (PF) 2 % IJ SOLN
INTRAMUSCULAR | Status: AC
  Filled 2024-03-02: qty 5

## 2024-03-02 MED ORDER — METOCLOPRAMIDE HCL 5 MG/ML IJ SOLN: 5.0000 mg | Freq: Three times a day (TID) | INTRAMUSCULAR | Status: DC | PRN

## 2024-03-02 MED ORDER — EPINEPHRINE PF 1 MG/ML IJ SOLN
INTRAMUSCULAR | Status: AC
Start: 2024-03-02 — End: 2024-03-02
  Filled 2024-03-02: qty 1

## 2024-03-02 MED ORDER — FENTANYL CITRATE (PF) 100 MCG/2ML IJ SOLN: 25.0000 ug | INTRAMUSCULAR | Status: DC | PRN

## 2024-03-02 MED ORDER — TRANEXAMIC ACID-NACL 1000-0.7 MG/100ML-% IV SOLN
INTRAVENOUS | Status: AC
  Filled 2024-03-02: qty 100

## 2024-03-02 MED ORDER — PHENYLEPHRINE 80 MCG/ML (10ML) SYRINGE FOR IV PUSH (FOR BLOOD PRESSURE SUPPORT)
PREFILLED_SYRINGE | INTRAVENOUS | Status: AC
  Filled 2024-03-02: qty 10

## 2024-03-02 MED ORDER — KETOROLAC TROMETHAMINE 15 MG/ML IJ SOLN
7.5000 mg | Freq: Four times a day (QID) | INTRAMUSCULAR | Status: AC
  Administered 2024-03-03 (×4): 7.5 mg via INTRAVENOUS
  Filled 2024-03-02 (×4): qty 1

## 2024-03-02 MED ORDER — TIZANIDINE HCL 4 MG PO TABS: 4.0000 mg | ORAL_TABLET | Freq: Three times a day (TID) | ORAL | Status: DC | PRN

## 2024-03-02 MED ORDER — INSULIN ASPART 100 UNIT/ML IJ SOLN
0.0000 [IU] | Freq: Three times a day (TID) | INTRAMUSCULAR | Status: DC
  Filled 2024-03-02 (×2): qty 1

## 2024-03-02 MED ORDER — FENTANYL CITRATE (PF) 100 MCG/2ML IJ SOLN
INTRAMUSCULAR | Status: DC | PRN
  Administered 2024-03-02 (×2): 25 ug via INTRAVENOUS

## 2024-03-02 MED ORDER — BISACODYL 10 MG RE SUPP: 10.0000 mg | Freq: Every day | RECTAL | Status: DC | PRN

## 2024-03-02 MED ORDER — ACETAMINOPHEN 10 MG/ML IV SOLN
INTRAVENOUS | Status: AC
  Filled 2024-03-02: qty 100

## 2024-03-02 MED ORDER — METOPROLOL SUCCINATE ER 25 MG PO TB24
25.0000 mg | ORAL_TABLET | Freq: Every day | ORAL | Status: DC
  Administered 2024-03-03 – 2024-03-05 (×3): 25 mg via ORAL
  Filled 2024-03-02 (×3): qty 1

## 2024-03-02 MED ORDER — BUPIVACAINE HCL (PF) 0.5 % IJ SOLN
INTRAMUSCULAR | Status: AC
  Filled 2024-03-02: qty 30

## 2024-03-02 MED ORDER — OXYCODONE HCL 5 MG PO TABS: 5.0000 mg | ORAL_TABLET | Freq: Once | ORAL | Status: DC | PRN

## 2024-03-02 MED ORDER — LIDOCAINE HCL (CARDIAC) PF 100 MG/5ML IV SOSY
PREFILLED_SYRINGE | INTRAVENOUS | Status: DC | PRN
  Administered 2024-03-02: 100 mg via INTRAVENOUS

## 2024-03-02 MED ORDER — BUPIVACAINE-EPINEPHRINE (PF) 0.5% -1:200000 IJ SOLN
INTRAMUSCULAR | Status: DC | PRN
  Administered 2024-03-02: 20 mL

## 2024-03-02 MED ORDER — ONDANSETRON HCL 4 MG PO TABS: 4.0000 mg | ORAL_TABLET | Freq: Four times a day (QID) | ORAL | Status: DC | PRN

## 2024-03-02 MED ORDER — ONDANSETRON HCL 4 MG/2ML IJ SOLN
INTRAMUSCULAR | Status: DC | PRN
  Administered 2024-03-02: 4 mg via INTRAVENOUS

## 2024-03-02 MED ORDER — HYDROMORPHONE HCL 1 MG/ML IJ SOLN
0.5000 mg | INTRAMUSCULAR | Status: DC | PRN
  Administered 2024-03-02: 0.5 mg via INTRAVENOUS
  Filled 2024-03-02: qty 0.5

## 2024-03-02 MED ORDER — ONDANSETRON HCL 4 MG/2ML IJ SOLN: 4.0000 mg | Freq: Once | INTRAMUSCULAR | Status: DC | PRN

## 2024-03-02 MED ORDER — ACETAMINOPHEN 325 MG PO TABS: 325.0000 mg | ORAL_TABLET | Freq: Four times a day (QID) | ORAL | Status: DC | PRN

## 2024-03-02 MED ORDER — FLEET ENEMA RE ENEM: 1.0000 | ENEMA | Freq: Once | RECTAL | Status: DC | PRN

## 2024-03-02 MED ORDER — KETAMINE HCL 50 MG/5ML IJ SOSY
PREFILLED_SYRINGE | INTRAMUSCULAR | Status: DC | PRN
  Administered 2024-03-02: 10 mg via INTRAVENOUS
  Administered 2024-03-02: 20 mg via INTRAVENOUS

## 2024-03-02 MED ORDER — HYDROCODONE-ACETAMINOPHEN 5-325 MG PO TABS: 1.0000 | ORAL_TABLET | ORAL | Status: DC | PRN

## 2024-03-02 MED ORDER — PROPOFOL 10 MG/ML IV BOLUS
INTRAVENOUS | Status: DC | PRN
  Administered 2024-03-02: 100 mg via INTRAVENOUS

## 2024-03-02 MED ORDER — DIPHENHYDRAMINE HCL 12.5 MG/5ML PO ELIX: 12.5000 mg | ORAL_SOLUTION | ORAL | Status: DC | PRN

## 2024-03-02 MED ORDER — BUPIVACAINE LIPOSOME 1.3 % IJ SUSP
INTRAMUSCULAR | Status: AC
Start: 2024-03-02 — End: 2024-03-02
  Filled 2024-03-02: qty 10

## 2024-03-02 MED ORDER — ENOXAPARIN SODIUM 40 MG/0.4ML IJ SOSY
40.0000 mg | PREFILLED_SYRINGE | INTRAMUSCULAR | Status: DC
  Administered 2024-03-03 – 2024-03-05 (×3): 40 mg via SUBCUTANEOUS
  Filled 2024-03-02 (×3): qty 0.4

## 2024-03-02 MED ORDER — DOCUSATE SODIUM 100 MG PO CAPS
100.0000 mg | ORAL_CAPSULE | Freq: Two times a day (BID) | ORAL | Status: DC
  Administered 2024-03-02 – 2024-03-05 (×6): 100 mg via ORAL
  Filled 2024-03-02 (×6): qty 1

## 2024-03-02 MED ORDER — SODIUM CHLORIDE 0.9 % IV SOLN
12.5000 mg | Freq: Once | INTRAVENOUS | Status: AC
  Administered 2024-03-02: 12.5 mg via INTRAVENOUS
  Filled 2024-03-02: qty 12.5
  Filled 2024-03-02: qty 0.5

## 2024-03-02 MED ORDER — MUPIROCIN 2 % EX OINT
1.0000 | TOPICAL_OINTMENT | Freq: Two times a day (BID) | CUTANEOUS | Status: DC
  Administered 2024-03-02 – 2024-03-05 (×6): 1 via NASAL
  Filled 2024-03-02: qty 22

## 2024-03-02 MED ORDER — HYDROMORPHONE HCL 1 MG/ML IJ SOLN
1.0000 mg | Freq: Once | INTRAMUSCULAR | Status: AC
  Administered 2024-03-02: 1 mg via INTRAVENOUS
  Filled 2024-03-02: qty 1

## 2024-03-02 MED ORDER — MAGNESIUM HYDROXIDE 400 MG/5ML PO SUSP: 30.0000 mL | Freq: Every day | ORAL | Status: DC | PRN

## 2024-03-02 MED ORDER — TRANEXAMIC ACID-NACL 1000-0.7 MG/100ML-% IV SOLN
INTRAVENOUS | Status: DC | PRN
  Administered 2024-03-02: 1000 mg via INTRAVENOUS

## 2024-03-02 MED ORDER — ONDANSETRON HCL 4 MG/2ML IJ SOLN
INTRAMUSCULAR | Status: AC
  Filled 2024-03-02: qty 2

## 2024-03-02 MED ORDER — MIDAZOLAM HCL 2 MG/2ML IJ SOLN
INTRAMUSCULAR | Status: AC
  Filled 2024-03-02: qty 2

## 2024-03-02 MED ORDER — KETOROLAC TROMETHAMINE 15 MG/ML IJ SOLN
15.0000 mg | Freq: Once | INTRAMUSCULAR | Status: AC
  Administered 2024-03-02: 15 mg via INTRAVENOUS
  Filled 2024-03-02: qty 1

## 2024-03-02 MED ORDER — ONDANSETRON HCL 4 MG PO TABS
4.0000 mg | ORAL_TABLET | Freq: Four times a day (QID) | ORAL | Status: DC | PRN
  Administered 2024-03-03 (×2): 4 mg via ORAL
  Filled 2024-03-02 (×3): qty 1

## 2024-03-02 MED ORDER — METOPROLOL TARTRATE 5 MG/5ML IV SOLN
INTRAVENOUS | Status: DC | PRN
  Administered 2024-03-02 (×2): 1 mg via INTRAVENOUS

## 2024-03-02 MED ORDER — CEFAZOLIN SODIUM-DEXTROSE 2-4 GM/100ML-% IV SOLN
INTRAVENOUS | Status: AC
  Filled 2024-03-02: qty 100

## 2024-03-02 MED ORDER — KETAMINE HCL 50 MG/5ML IJ SOSY
PREFILLED_SYRINGE | INTRAMUSCULAR | Status: AC
  Filled 2024-03-02: qty 5

## 2024-03-02 MED ORDER — ATORVASTATIN CALCIUM 10 MG PO TABS
10.0000 mg | ORAL_TABLET | Freq: Every day | ORAL | Status: DC
  Administered 2024-03-03 – 2024-03-05 (×3): 10 mg via ORAL
  Filled 2024-03-02 (×3): qty 1

## 2024-03-02 MED ORDER — ONDANSETRON HCL 4 MG/2ML IJ SOLN
4.0000 mg | Freq: Four times a day (QID) | INTRAMUSCULAR | Status: DC | PRN
  Administered 2024-03-02: 4 mg via INTRAVENOUS

## 2024-03-02 MED ORDER — PHENYLEPHRINE 80 MCG/ML (10ML) SYRINGE FOR IV PUSH (FOR BLOOD PRESSURE SUPPORT)
PREFILLED_SYRINGE | INTRAVENOUS | Status: DC | PRN
  Administered 2024-03-02 (×3): 160 ug via INTRAVENOUS
  Administered 2024-03-02: 80 ug via INTRAVENOUS

## 2024-03-02 MED ORDER — AMLODIPINE BESYLATE 5 MG PO TABS
5.0000 mg | ORAL_TABLET | Freq: Every day | ORAL | Status: DC
  Administered 2024-03-03 – 2024-03-05 (×3): 5 mg via ORAL
  Filled 2024-03-02 (×3): qty 1

## 2024-03-02 MED ORDER — BUPIVACAINE LIPOSOME 1.3 % IJ SUSP
INTRAMUSCULAR | Status: DC | PRN
  Administered 2024-03-02: 10 mL

## 2024-03-02 SURGICAL SUPPLY — 46 items
BIT DRILL 4.3MMS DISTAL GRDTED (BIT) IMPLANT
BNDG COHESIVE 4X5 TAN STRL LF (GAUZE/BANDAGES/DRESSINGS) ×1 IMPLANT
BNDG COHESIVE 6X5 TAN ST LF (GAUZE/BANDAGES/DRESSINGS) ×1 IMPLANT
CHLORAPREP W/TINT 26 (MISCELLANEOUS) ×2 IMPLANT
DRAPE C-ARMOR (DRAPES) ×1 IMPLANT
DRAPE SHEET LG 3/4 BI-LAMINATE (DRAPES) ×1 IMPLANT
DRSG MEPILEX SACRM 8.7X9.8 (GAUZE/BANDAGES/DRESSINGS) ×1 IMPLANT
DRSG OPSITE POSTOP 3X4 (GAUZE/BANDAGES/DRESSINGS) IMPLANT
DRSG OPSITE POSTOP 4X6 (GAUZE/BANDAGES/DRESSINGS) IMPLANT
ELECT CAUTERY BLADE 6.4 (BLADE) ×1 IMPLANT
ELECTRODE REM PT RTRN 9FT ADLT (ELECTROSURGICAL) ×1 IMPLANT
GAUZE SPONGE 4X4 12PLY STRL (GAUZE/BANDAGES/DRESSINGS) ×1 IMPLANT
GAUZE XEROFORM 1X8 LF (GAUZE/BANDAGES/DRESSINGS) IMPLANT
GAUZE XEROFORM 4X4 STRL (GAUZE/BANDAGES/DRESSINGS) IMPLANT
GLOVE BIO SURGEON STRL SZ8 (GLOVE) ×2 IMPLANT
GLOVE INDICATOR 8.0 STRL GRN (GLOVE) ×1 IMPLANT
GLOVE SURG SYN 7.0 (GLOVE) ×1 IMPLANT
GLOVE SURG SYN 7.0 PF PI (GLOVE) IMPLANT
GOWN STRL REUS W/ TWL LRG LVL3 (GOWN DISPOSABLE) ×1 IMPLANT
GOWN STRL REUS W/ TWL XL LVL3 (GOWN DISPOSABLE) ×1 IMPLANT
GUIDEPIN VERSANAIL DSP 3.2X444 (ORTHOPEDIC DISPOSABLE SUPPLIES) IMPLANT
GUIDEWIRE BALL NOSE 100CM (WIRE) IMPLANT
HANDLE YANKAUER SUCT OPEN TIP (MISCELLANEOUS) ×1 IMPLANT
MANIFOLD NEPTUNE II (INSTRUMENTS) ×1 IMPLANT
MAT ABSORB FLUID 56X50 GRAY (MISCELLANEOUS) ×1 IMPLANT
NAIL HIP FRACTURE 11X380MM (Nail) IMPLANT
NDL FILTER BLUNT 18X1 1/2 (NEEDLE) ×1 IMPLANT
NDL HYPO 22X1.5 SAFETY MO (MISCELLANEOUS) ×1 IMPLANT
NEEDLE FILTER BLUNT 18X1 1/2 (NEEDLE) ×1 IMPLANT
NEEDLE HYPO 22X1.5 SAFETY MO (MISCELLANEOUS) ×1 IMPLANT
NS IRRIG 500ML POUR BTL (IV SOLUTION) ×1 IMPLANT
PACK HIP COMPR (MISCELLANEOUS) ×1 IMPLANT
PENCIL SMOKE EVACUATOR (MISCELLANEOUS) ×1 IMPLANT
SCREW BONE CORTICAL 5.0X50 (Screw) IMPLANT
SCREW CORTICL BON 5.0MM X 46MM (Screw) IMPLANT
SCREW LAG 10.5MMX105MM HFN (Screw) IMPLANT
STAPLER SKIN PROX 35W (STAPLE) ×1 IMPLANT
STRAP SAFETY 5IN WIDE (MISCELLANEOUS) ×1 IMPLANT
SUT VIC AB 0 CT1 36 (SUTURE) ×1 IMPLANT
SUT VIC AB 1 CT1 36 (SUTURE) ×1 IMPLANT
SUT VIC AB 2-0 CT1 (SUTURE) ×2 IMPLANT
SYR 10ML LL (SYRINGE) ×1 IMPLANT
SYR 30ML LL (SYRINGE) ×1 IMPLANT
TAPE MICROFOAM 4IN (TAPE) ×1 IMPLANT
TRAP FLUID SMOKE EVACUATOR (MISCELLANEOUS) ×1 IMPLANT
WATER STERILE IRR 500ML POUR (IV SOLUTION) ×1 IMPLANT

## 2024-03-02 NOTE — Assessment & Plan Note (Signed)
 CKD stage IIIa.  Gentle IV fluid hydration.  Will place on sliding scale.

## 2024-03-02 NOTE — Assessment & Plan Note (Signed)
 After IV pain medications.  Try to switch to Tylenol  as quickly as possible.  Discontinue IV Dilaudid.

## 2024-03-02 NOTE — ED Triage Notes (Signed)
 Pt arrives via [facility] from home after tripping and falling up the stairs. Pt c/o R hip pain. Pt received 100mcg fentanyl en route. No LOC, no blood thinners. No previous injuries to R hip.   CBG 167

## 2024-03-02 NOTE — Anesthesia Preprocedure Evaluation (Signed)
 Anesthesia Evaluation  Patient identified by MRN, date of birth, ID band Patient awake    Reviewed: Allergy & Precautions, NPO status , Patient's Chart, lab work & pertinent test results  History of Anesthesia Complications Negative for: history of anesthetic complications  Airway Mallampati: I   Neck ROM: Full    Dental  (+) Missing   Pulmonary former smoker (quit 2008)   Pulmonary exam normal breath sounds clear to auscultation       Cardiovascular hypertension, Normal cardiovascular exam+ dysrhythmias Supra Ventricular Tachycardia  Rhythm:Regular Rate:Normal     Neuro/Psych CVA (2008; occasional right-sided weakness)    GI/Hepatic negative GI ROS,,,  Endo/Other  diabetes    Renal/GU      Musculoskeletal   Abdominal   Peds  Hematology negative hematology ROS (+)   Anesthesia Other Findings   Reproductive/Obstetrics                             Anesthesia Physical Anesthesia Plan  ASA: 3  Anesthesia Plan: General   Post-op Pain Management:    Induction: Intravenous and Rapid sequence  PONV Risk Score and Plan: 3 and Ondansetron , Dexamethasone and Treatment may vary due to age or medical condition  Airway Management Planned: Oral ETT  Additional Equipment:   Intra-op Plan:   Post-operative Plan: Extubation in OR  Informed Consent: I have reviewed the patients History and Physical, chart, labs and discussed the procedure including the risks, benefits and alternatives for the proposed anesthesia with the patient or authorized representative who has indicated his/her understanding and acceptance.     Dental advisory given  Plan Discussed with: CRNA  Anesthesia Plan Comments: (Plan for GETA due to nausea and vomiting preop. Patient consented for risks of anesthesia including but not limited to:  - adverse reactions to medications - damage to eyes, teeth, lips or other oral  mucosa - nerve damage due to positioning  - sore throat or hoarseness - damage to heart, brain, nerves, lungs, other parts of body or loss of life  Informed patient about role of CRNA in peri- and intra-operative care.  Patient voiced understanding.)       Anesthesia Quick Evaluation

## 2024-03-02 NOTE — ED Provider Notes (Signed)
 [facility] Provider Note   Event Date/Time   First MD Initiated Contact with Patient 03/02/24 0915     (approximate) History  Fall  HPI Monica Nixon is a 68 y.o. female with a past medical history of type 2 diabetes, hypertension, hypercholesterolemia, and paroxysmal SVT who presents complaining of right hip pain after tripping and falling up stairs.  Patient states that she lost her footing and fell directly onto the right hip on the corner of one of her stairs.  Patient states she was unable to ambulate on this side since the fall and came via EMS.  Patient denies any head trauma, loss of consciousness, blood thinner use ROS: Patient currently denies any vision changes, tinnitus, difficulty speaking, facial droop, sore throat, chest pain, shortness of breath, abdominal pain, nausea/vomiting/diarrhea, dysuria, or weakness/numbness/paresthesias in any extremity   Physical Exam  Triage Vital Signs: ED Triage Vitals  Encounter Vitals Group     BP 03/02/24 0912 (!) 175/72     Girls Systolic BP Percentile --      Girls Diastolic BP Percentile --      Boys Systolic BP Percentile --      Boys Diastolic BP Percentile --      Pulse Rate 03/02/24 0912 71     Resp 03/02/24 0912 15     Temp 03/02/24 0912 98.6 F (37 C)     Temp Source 03/02/24 0912 Oral     SpO2 03/02/24 0912 100 %     Weight --      Height --      Head Circumference --      Peak Flow --      Pain Score 03/02/24 0910 5     Pain Loc --      Pain Education --      Exclude from Growth Chart --    Most recent vital signs: Vitals:   03/02/24 2103 03/03/24 0457  BP: (!) 149/74 (!) 147/64  Pulse: 94 97  Resp: 18 18  Temp: 99.5 F (37.5 C) 99.1 F (37.3 C)  SpO2: 100% 99%   General: Awake, oriented x4. CV:  Good peripheral perfusion. Resp:  Normal effort. Abd:  No distention. Other:  Elderly overweight Caucasian female resting comfortably in no acute distress.  Right lower extremity  shortened and with significant tenderness to palpation at the right lateral hip.  Distally neurovascular intact ED Results / Procedures / Treatments  Labs (all labs ordered are listed, but only abnormal results are displayed) Labs Reviewed  CBC WITH DIFFERENTIAL/PLATELET - Abnormal; Notable for the following components:      Result Value   WBC 12.0 (*)    Neutro Abs 10.1 (*)    All other components within normal limits  COMPREHENSIVE METABOLIC PANEL WITH GFR - Abnormal; Notable for the following components:   Glucose, Bld 191 (*)    Creatinine, Ser 1.19 (*)    GFR, Estimated 51 (*)    All other components within normal limits  GLUCOSE, CAPILLARY - Abnormal; Notable for the following components:   Glucose-Capillary 177 (*)    All other components within normal limits  BASIC METABOLIC PANEL WITH GFR - Abnormal; Notable for the following components:   Glucose, Bld 170 (*)    Creatinine, Ser 1.23 (*)    Calcium 8.1 (*)    GFR, Estimated 49 (*)    All other components within normal limits  CBC - Abnormal; Notable for the following components:   WBC  15.9 (*)    RBC 3.47 (*)    Hemoglobin 10.5 (*)    HCT 31.0 (*)    All other components within normal limits  GLUCOSE, CAPILLARY - Abnormal; Notable for the following components:   Glucose-Capillary 154 (*)    All other components within normal limits  GLUCOSE, CAPILLARY - Abnormal; Notable for the following components:   Glucose-Capillary 199 (*)    All other components within normal limits  SURGICAL PCR SCREEN  HEMOGLOBIN A1C  HIV ANTIBODY (ROUTINE TESTING W REFLEX)  TYPE AND SCREEN  ABO/RH   RADIOLOGY ED MD interpretation: X-ray of the right hip shows displaced intertrochanteric fracture of the right femur - All radiology independently interpreted and agree with radiology assessment Official radiology report(s): DG HIP UNILAT WITH PELVIS 2-3 VIEWS RIGHT Result Date: 03/02/2024 CLINICAL DATA:  Elective surgery. EXAM: DG HIP  (WITH OR WITHOUT PELVIS) 2-3V RIGHT COMPARISON:  Radiograph earlier today FINDINGS: Four fluoroscopic spot views of the right hip and femur submitted from the operating room. Femoral intramedullary nail with trans trochanteric and distal locking screw fixation traverse proximal femur fracture. Fluoroscopy time 1 minutes 22 seconds. Dose 13.55 mGy IMPRESSION: Intraoperative fluoroscopy during proximal femur fracture fixation. Electronically Signed   By: Andrea Gasman M.D.   On: 03/02/2024 18:52   DG C-Arm 1-60 Min-No Report Result Date: 03/02/2024 Fluoroscopy was utilized by the requesting physician.  No radiographic interpretation.   DG C-Arm 1-60 Min-No Report Result Date: 03/02/2024 Fluoroscopy was utilized by the requesting physician.  No radiographic interpretation.   DG Chest 1 View Result Date: 03/02/2024 CLINICAL DATA:  Right hip pain post fall EXAM: CHEST  1 VIEW COMPARISON:  None Available. FINDINGS: Lungs are clear. Heart size and mediastinal contours are within normal limits. Aortic Atherosclerosis (ICD10-170.0). No effusion. Vertebral endplate spurring at multiple levels in the lower thoracic spine. IMPRESSION: No acute cardiopulmonary disease. Electronically Signed   By: JONETTA Faes M.D.   On: 03/02/2024 11:16   DG Hip Unilat W or Wo Pelvis 2-3 Views Right Result Date: 03/02/2024 CLINICAL DATA:  Pain post fall EXAM: DG HIP (WITH OR WITHOUT PELVIS) 2-3V RIGHT COMPARISON:  None Available. FINDINGS: Displaced oblique minimally comminuted intertrochanteric fracture of the right femur, with 3.6 cm medial displacement and some override of distal fracture fragment. No dislocation. Bony pelvis intact. Mild spondylitic changes in the visualized lower lumbar spine. IMPRESSION: Displaced intertrochanteric fracture of the right femur. Electronically Signed   By: JONETTA Faes M.D.   On: 03/02/2024 11:16   PROCEDURES: Critical Care performed: No Procedures MEDICATIONS ORDERED IN ED: Medications   HYDROmorphone (DILAUDID) injection 0.5 mg ( Intravenous MAR Unhold 03/02/24 1945)  Chlorhexidine Gluconate Cloth 2 % PADS 6 each (6 each Topical Given 03/03/24 0534)  mupirocin ointment (BACTROBAN) 2 % 1 Application (1 Application Nasal Given 03/02/24 2204)  amLODipine (NORVASC) tablet 5 mg ( Oral MAR Unhold 03/02/24 1945)  losartan (COZAAR) tablet 100 mg ( Oral MAR Unhold 03/02/24 1945)  atorvastatin (LIPITOR) tablet 10 mg ( Oral MAR Unhold 03/02/24 1945)  metoprolol succinate (TOPROL-XL) 24 hr tablet 25 mg ( Oral MAR Unhold 03/02/24 1945)  insulin aspart (novoLOG) injection 0-6 Units ( Subcutaneous MAR Unhold 03/02/24 1945)  insulin aspart (novoLOG) injection 0-5 Units ( Subcutaneous Not Given 03/02/24 2204)  0.9 %  sodium chloride  infusion ( Intravenous Stopped 03/03/24 0531)  tiZANidine (ZANAFLEX) tablet 4 mg (has no administration in time range)  diphenhydrAMINE (BENADRYL) 12.5 MG/5ML elixir 12.5-25 mg (has no administration in time  range)  docusate sodium (COLACE) capsule 100 mg (100 mg Oral Given 03/02/24 2211)  magnesium hydroxide (MILK OF MAGNESIA) suspension 30 mL (has no administration in time range)  bisacodyl (DULCOLAX) suppository 10 mg (has no administration in time range)  sodium phosphate (FLEET) enema 1 enema (has no administration in time range)  ondansetron  (ZOFRAN ) tablet 4 mg (4 mg Oral Given 03/03/24 0224)    Or  ondansetron  (ZOFRAN ) injection 4 mg ( Intravenous See Alternative 03/03/24 0224)  metoCLOPramide (REGLAN) tablet 5-10 mg (has no administration in time range)    Or  metoCLOPramide (REGLAN) injection 5-10 mg (has no administration in time range)  enoxaparin (LOVENOX) injection 40 mg (has no administration in time range)  acetaminophen  (TYLENOL ) tablet 325-650 mg (has no administration in time range)  acetaminophen  (TYLENOL ) tablet 1,000 mg (1,000 mg Oral Given 03/03/24 0531)  oxyCODONE  (Oxy IR/ROXICODONE ) immediate release tablet 5-10 mg (has no administration in time  range)  ketorolac (TORADOL) 15 MG/ML injection 7.5 mg (7.5 mg Intravenous Given 03/03/24 0315)  fentaNYL (SUBLIMAZE) injection 100 mcg (100 mcg Intravenous Given 03/02/24 1004)  ondansetron  (ZOFRAN ) injection 4 mg (4 mg Intravenous Given 03/02/24 1056)  HYDROmorphone (DILAUDID) injection 1 mg (1 mg Intravenous Given 03/02/24 1131)  promethazine (PHENERGAN) 12.5 mg in sodium chloride  0.9 % 50 mL IVPB (12.5 mg Intravenous New Bag/Given 03/02/24 1348)  ceFAZolin (ANCEF) IVPB 2g/100 mL premix ( Intravenous Infusion Verify 03/03/24 0534)  ketorolac (TORADOL) 15 MG/ML injection 15 mg (15 mg Intravenous Given 03/02/24 1940)   IMPRESSION / MDM / ASSESSMENT AND PLAN / ED COURSE  I reviewed the triage vital signs and the nursing notes.                             The patient is on the cardiac monitor to evaluate for evidence of arrhythmia and/or significant heart rate changes. Patient's presentation is most consistent with acute presentation with potential threat to life or bodily function. Patient is a 68 year old female that presents for right hip pain Workup: XR hip Findings: Right intertrochanteric closed displaced femur fracture without dislocation Consult: Orthopedic Surgery, hospitalist  Patient does not currently demonstrate complications of fracture such as compartment syndrome, arterial or nerve injury.  Interventions: analgesia Disposition: Admit   FINAL CLINICAL IMPRESSION(S) / ED DIAGNOSES   Final diagnoses:  Closed displaced intertrochanteric fracture of right femur, initial encounter (HCC)  Fall, initial encounter   Rx / DC Orders   ED Discharge Orders     None      Note:  This document was prepared using Dragon voice recognition software and may include unintentional dictation errors.   Hardie Veltre K, MD 03/03/24 [phone]

## 2024-03-02 NOTE — Assessment & Plan Note (Signed)
 -  Continue Lipitor

## 2024-03-02 NOTE — Op Note (Signed)
 03/02/2024  5:58 PM  Patient:   Monica Nixon  Pre-Op Diagnosis:   Displaced intertrochanteric fracture right hip.  Post-Op Diagnosis:   Same  Procedure:   Reduction and internal fixation of displaced intertrochanteric right hip fracture with Biomet Affixis TFN nail.  Surgeon:   DOROTHA Reyes Maltos, MD  Assistant:   None  Anesthesia:   GET  Findings:   As above  Complications:   None  EBL:   100 cc  Fluids:   1200 cc crystalloid  UOP:   None  TT:   None  Drains:   None  Closure:   Staples  Implants:   Biomet Affixis 11 x 380 mm TFN with a 105 mm lag screw and 46 and 50 mm distal interlocking screws  Brief Clinical Note:   The patient is a 68 year old female in otherwise reasonably good health who sustained the above-noted injury earlier this morning when she tripped and fell while going up a set of stairs to her deck, injuring her right hip. The patient was brought to the emergency room where x-rays demonstrated the above-noted injury. The patient has been cleared medically and presents at this time for reduction and internal fixation of the displaced intertrochanteric right hip fracture.  Procedure:   The patient was brought into the operating room and lain in the supine position. After adequate general endotracheal intubation and anesthesia was obtained, the patient was repositioned in the supine position on the fracture table. The uninjured leg was placed in a flexed and abducted position while the injured lower extremity was placed in longitudinal traction. The fracture was reduced using longitudinal traction and internal rotation. The adequacy of reduction was verified fluoroscopically in AP and lateral projections and found to be satisfactory. The lateral aspects of the right hip and thigh were prepped with ChloraPrep solution before being draped sterilely. Preoperative antibiotics were administered. A timeout was performed to verify the appropriate surgical site.   The  greater trochanter was identified fluoroscopically and an approximately 5-6 cm incision made about 2-3 fingerbreadths above the tip of the greater trochanter. The incision was carried down through the subcutaneous tissues to expose the gluteal fascia. This was split the length of the incision, providing access to the tip of the trochanter. Under fluoroscopic guidance, a guidewire was drilled through the tip of the trochanter into the proximal metaphysis to the level of the lesser trochanter. After verifying its position fluoroscopically in AP and lateral projections, it was overreamed with the initial reamer to the depth of the lesser trochanter. A guidewire was passed down through the femoral canal to the supracondylar region. The adequacy of guidewire position was verified fluoroscopically in AP and lateral projections before the length of the guidewire within the canal was measured and found to be 395 mm. Therefore, a 380 mm length nail was selected. The guidewire was overreamed sequentially using the flexible reamers, beginning with a 9.5 mm reamer and progressing to a 13 mm reamer. This provided good cortical chatter. The 11 x 380 mm Biomet Affixis TFN rod was selected and advanced to the appropriate depth, as verified fluoroscopically.   The guide system for the lag screw was positioned and advanced through an approximately 2 cm stab incision over the lateral aspect of the proximal femur. The guidewire was drilled up through the trochanteric femoral nail and into the femoral neck to rest within 5 mm of subchondral bone. After verifying its position in the femoral neck and head in both AP and lateral  projections, the guidewire was measured and found to be optimally replicated by a 105 mm lag screw. The guidewire was overreamed to the appropriate depth before the lag screw was inserted and advanced to the appropriate depth as verified fluoroscopically in AP and lateral projections. The locking screw was  advanced, then backed off a quarter turn to set the lag screw. Again the adequacy of hardware position and fracture reduction was verified fluoroscopically in AP and lateral projections and found to be excellent.  Attention was directed distally. Using the perfect circle technique, the leg and fluoroscopy machine were positioned appropriately. An approximately 1.5 cm stab incision was made over the skin at the appropriate point before the drill bit was advanced through the cortex and across the static hole of the nail. The appropriate length of the screw was determined before the 46 mm distal interlocking screw was positioned, then advanced and tightened securely.  Given that this fracture pattern was a subtrochanteric-equivalent pattern, it was elected to place a second distal interlocking screw.  Again, using the perfect circle technique, an approximately 1.5 cm stab incision was made over the skin at the appropriate point before the drill bit was advanced through the cortex and across the static hole of the nail. The appropriate length of the screw was determined before the 50 mm distal interlocking screw was positioned, then advanced and tightened securely. The adequacy of distal interlocking screw position was were verified fluoroscopically in AP and lateral projections and found to be excellent.  The wounds were irrigated thoroughly with sterile saline solution before the abductor fascia was reapproximated using #1 Vicryl interrupted sutures. The subcutaneous tissues were closed using 2-0 Vicryl interrupted sutures. The skin was closed using staples. A solution of 20 cc of 0.5% Sensorcaine with epinephrine and 10 cc of Exparel was injected in and around all incisions to help with postoperative pain control. Sterile occlusive dressings were applied to all wounds before the patient was transferred back to her hospital bed. The patient was then awakened, extubated, and returned to the recovery room in  satisfactory condition after tolerating the procedure well.

## 2024-03-02 NOTE — Anesthesia Postprocedure Evaluation (Signed)
 Anesthesia Post Note  Patient: Saharra Santo  Procedure(s) Performed: FIXATION, FRACTURE, INTERTROCHANTERIC, WITH INTRAMEDULLARY ROD (Right)  Patient location during evaluation: PACU Anesthesia Type: General Level of consciousness: awake and alert Pain management: pain level controlled Vital Signs Assessment: post-procedure vital signs reviewed and stable Respiratory status: spontaneous breathing, nonlabored ventilation, respiratory function stable and patient connected to nasal cannula oxygen Cardiovascular status: blood pressure returned to baseline and stable Postop Assessment: no apparent nausea or vomiting Anesthetic complications: no   No notable events documented.   Last Vitals:  Vitals:   03/02/24 1815 03/02/24 1830  BP: (!) 141/56   Pulse: 93 91  Resp: 11 17  Temp:    SpO2: 100% 100%    Last Pain:  Vitals:   03/02/24 1800  TempSrc:   PainSc: Asleep                 Lendia LITTIE Mae

## 2024-03-02 NOTE — ED Notes (Signed)
 Patient transported to X-ray

## 2024-03-02 NOTE — ED Notes (Signed)
Informed RN bed assigned 

## 2024-03-02 NOTE — Consult Note (Signed)
 ORTHOPAEDIC CONSULTATION  REQUESTING PHYSICIAN: Josette Ade, MD  Chief Complaint:   Right hip pain.  History of Present Illness: Monica Nixon is a 68 y.o. female with a history of hypertension, hypercholesterolemia, diabetes, and SVT who normally lives independently with her husband was in her usual state of health until early this morning.  Apparently she was walking up her steps to her deck when she somehow got her feet tangled and fell hard onto her right side, injuring her right hip.  She was brought to the emergency room where x-rays demonstrated a displaced intertrochanteric fracture of the right hip.  The patient denies any associated injuries.  She did not strike her head or lose consciousness.  The patient also denies any lightheadedness, dizziness, chest pain, shortness of breath, or other symptoms which may have precipitated her fall.  Past Medical History:  Diagnosis Date   Diabetes mellitus without complication (HCC)    Hypercholesteremia    Hypertension    Stroke [facility])    SVT (supraventricular tachycardia) (HCC)    Past Surgical History:  Procedure Laterality Date   CHOLECYSTECTOMY     COLONOSCOPY WITH PROPOFOL  N/A 03/31/2018   Procedure: COLONOSCOPY WITH PROPOFOL ;  Surgeon: Viktoria Lamar DASEN, MD;  Location: [facility] ENDOSCOPY;  Service: Endoscopy;  Laterality: N/A;   TRIGGER FINGER RELEASE     Social History   Socioeconomic History   Marital status: Married    Spouse name: Not on file   Number of children: Not on file   Years of education: Not on file   Highest education level: Not on file  Occupational History   Not on file  Tobacco Use   Smoking status: Former   Smokeless tobacco: Never  Substance and Sexual Activity   Alcohol use: Yes   Drug use: Never   Sexual activity: Not on file  Other Topics Concern   Not on file  Social History Narrative   Not on file   Social Drivers of Health    Financial Resource Strain: Low Risk  (08/13/2023)   Received from [facility]   Overall Financial Resource Strain (CARDIA)    Difficulty of Paying Living Expenses: Not hard at all  Food Insecurity: No Food Insecurity (08/13/2023)   Received from [facility]   Hunger Vital Sign    Within the past 12 months, you worried that your food would run out before you got the money to buy more.: Never true    Within the past 12 months, the food you bought just didn't last and you didn't have money to get more.: Never true  Transportation Needs: No Transportation Needs (08/13/2023)   Received from [facility] - Transportation    In the past 12 months, has lack of transportation kept you from medical appointments or from getting medications?: No    Lack of Transportation (Non-Medical): No  Physical Activity: Not on file  Stress: Not on file  Social Connections: Not on file   Family History  Problem Relation Age of Onset   Hypertension Mother    Diabetes Mother    Hypercholesterolemia Mother    Heart failure Father    Breast cancer Neg Hx    Allergies  Allergen Reactions   Codeine Nausea And Vomiting    sensitive Pt is able to take as long as she eats prior to taking.   Prior to Admission medications   Medication Sig Start Date End Date Taking? Authorizing Provider  amLODipine (  NORVASC) 5 MG tablet Take by mouth. 07/24/17  Yes [provider]  aspirin 81 MG chewable tablet Chew by mouth daily.   Yes [provider]  atorvastatin (LIPITOR) 10 MG tablet Take 10 mg by mouth daily. 03/02/24  Yes [provider]  Cholecalciferol (D 1000) 25 MCG (1000 UT) capsule Take 1,000 Units by mouth daily.   Yes [provider]  fluticasone (FLONASE) 50 MCG/ACT nasal spray Place 1 spray into the nose daily as needed for allergies or rhinitis. 11/13/23  Yes [provider]  glimepiride (AMARYL) 4 MG  tablet Take 4 mg by mouth 2 (two) times daily.   Yes [provider]  ibuprofen (ADVIL,MOTRIN) 200 MG tablet Take 1-2 tablets by mouth three times a day as needed for pain.   Yes [provider]  losartan (COZAAR) 100 MG tablet Take 100 mg by mouth daily.   Yes [provider]  metoprolol succinate (TOPROL-XL) 25 MG 24 hr tablet Take 25 mg by mouth daily.   Yes [provider]  Semaglutide, 1 MG/DOSE, 4 MG/3ML SOPN Inject 1 mg into the skin once a week. 08/13/23  Yes [provider]  tiZANidine (ZANAFLEX) 4 MG tablet Take 4 mg by mouth every 8 (eight) hours as needed for muscle spasms. 11/14/23  Yes [provider]   DG Chest 1 View Result Date: 03/02/2024 CLINICAL DATA:  Right hip pain post fall EXAM: CHEST  1 VIEW COMPARISON:  None Available. FINDINGS: Lungs are clear. Heart size and mediastinal contours are within normal limits. Aortic Atherosclerosis (ICD10-170.0). No effusion. Vertebral endplate spurring at multiple levels in the lower thoracic spine. IMPRESSION: No acute cardiopulmonary disease. Electronically Signed   By: JONETTA Faes M.D.   On: 03/02/2024 11:16   DG Hip Unilat W or Wo Pelvis 2-3 Views Right Result Date: 03/02/2024 CLINICAL DATA:  Pain post fall EXAM: DG HIP (WITH OR WITHOUT PELVIS) 2-3V RIGHT COMPARISON:  None Available. FINDINGS: Displaced oblique minimally comminuted intertrochanteric fracture of the right femur, with 3.6 cm medial displacement and some override of distal fracture fragment. No dislocation. Bony pelvis intact. Mild spondylitic changes in the visualized lower lumbar spine. IMPRESSION: Displaced intertrochanteric fracture of the right femur. Electronically Signed   By: JONETTA Faes M.D.   On: 03/02/2024 11:16    Positive ROS: All other systems have been reviewed and were otherwise negative with the exception of those mentioned in the HPI and as above.  Physical Exam: General:  Alert, no acute  distress Psychiatric:  Patient is competent for consent with normal mood and affect   Cardiovascular:  No pedal edema Respiratory:  No wheezing, non-labored breathing GI:  Abdomen is soft and non-tender Skin:  No lesions in the area of chief complaint Neurologic:  Sensation intact distally Lymphatic:  No axillary or cervical lymphadenopathy  Orthopedic Exam:  Orthopedic examination is limited to the right hip and lower extremity.  The right lower extremity is held in a shortened and internally rotated position as compared to the left lower extremity.  Skin inspection around the right hip is notable for mild swelling, but otherwise is unremarkable.  No erythema, abrasions, ecchymosis, or other skin abnormalities are identified.  She has mild-moderate tenderness to palpation of the lateral aspect of the hip.  She has more severe pain with any attempted active or passive motion of the hip.  She is grossly neurovascularly intact to the right lower extremity and foot.  X-rays:  X-rays of the pelvis and  right hip from earlier today are available for review and have been reviewed by myself.  The findings are as described above.  The fracture pattern is consistent with a reverse obliquity intertrochanteric fracture of the right hip.  No significant degenerative changes of the right hip joint are noted.    Assessment: Displaced intertrochanteric fracture right hip.  Plan: The treatment options, including both surgical and nonsurgical choices, have been discussed in detail with the patient and her husband who is at the bedside.  Both the patient and her husband would like to proceed with surgical intervention to include an intramedullary nailing of the displaced intertrochanteric fracture of the right hip.  The risks (including bleeding, infection, nerve and/or blood vessel injury, persistent or recurrent pain, loosening or failure of the components, leg length inequality, need for further surgery, blood  clots, strokes, heart attacks or arrhythmias, pneumonia, etc.) and benefits of the surgical procedure were discussed.  The patient states her understanding and agrees to proceed.  A formal written consent will be obtained by the nursing staff.  Thank you for asking me to participate in the care of this most pleasant young unfortunate woman.  I will be happy to follow her with you.   Monica Reyes Maltos, MD  Beeper #:  [phone]  03/02/2024 3:57 PM

## 2024-03-02 NOTE — Assessment & Plan Note (Addendum)
 Postoperative day 1 for reduction internal fixation of displaced right intertrochanteric fracture with Biomet FXS TFN nail.  Continue working with physical therapy.  Pain control.  With her best to switch over to Tylenol  as quickly as possible.  Patient states she is able to tolerate Toradol.

## 2024-03-02 NOTE — Assessment & Plan Note (Signed)
Continue Norvasc, Cozaar and Toprol

## 2024-03-02 NOTE — Transfer of Care (Signed)
 Immediate Anesthesia Transfer of Care Note  Patient: Monica Nixon  Procedure(s) Performed: FIXATION, FRACTURE, INTERTROCHANTERIC, WITH INTRAMEDULLARY ROD (Right)  Patient Location: PACU  Anesthesia Type:General  Level of Consciousness: awake, alert , and oriented  Airway & Oxygen Therapy: Patient Spontanous Breathing and Patient connected to nasal cannula oxygen  Post-op Assessment: Report given to RN and Post -op Vital signs reviewed and stable  Post vital signs: Reviewed and stable  Last Vitals:  Vitals Value Taken Time  BP 114/79   Temp 96.63f   Pulse 91 03/02/24 18:02  Resp 12 03/02/24 18:02  SpO2 100 % 03/02/24 18:02  Vitals shown include unfiled device data.  Last Pain:  Vitals:   03/02/24 1504  TempSrc: Temporal  PainSc: 7       Patients Stated Pain Goal: 0 (03/02/24 1504)  Complications: No notable events documented.

## 2024-03-02 NOTE — H&P (Signed)
 History and Physical    Patient: Monica Nixon FMW:[id number] DOB: [birth year] DOA: 03/02/2024 DOS: the patient was seen and examined on 03/02/2024 PCP: Auston Reyes BIRCH, MD  Patient coming from: Home  Chief Complaint:  Chief Complaint  Patient presents with   Fall   HPI: Monica Nixon is a 68 y.o. female with medical history significant of type 2 diabetes mellitus hypertension hyperlipidemia and prior stroke.  She presents to the hospital after going up steps on the desk she lost her balance and had a fall on her hip.  Came in for further evaluation.  In the ER she was found to have a displaced right hip fracture.  Hospitalist service was contacted for further evaluation. Review of Systems: Review of Systems  Constitutional:  Positive for weight loss. Negative for chills and fever.  HENT:  Negative for hearing loss.   Eyes:  Negative for blurred vision.  Respiratory:  Negative for cough.   Cardiovascular:  Negative for chest pain.  Gastrointestinal:  Positive for nausea and vomiting.  Genitourinary:  Negative for dysuria.  Musculoskeletal:  Positive for joint pain.  Skin:  Negative for rash.  Neurological:  Negative for dizziness.  Endo/Heme/Allergies:  Does not bruise/bleed easily.  Psychiatric/Behavioral:  Negative for depression.     Past Medical History:  Diagnosis Date   Diabetes mellitus without complication (HCC)    Hypercholesteremia    Hypertension    Stroke [facility])    SVT (supraventricular tachycardia) (HCC)    Past Surgical History:  Procedure Laterality Date   CHOLECYSTECTOMY     COLONOSCOPY WITH PROPOFOL  N/A 03/31/2018   Procedure: COLONOSCOPY WITH PROPOFOL ;  Surgeon: Viktoria Lamar DASEN, MD;  Location: [facility] ENDOSCOPY;  Service: Endoscopy;  Laterality: N/A;   TRIGGER FINGER RELEASE     Social History:  reports that she has quit smoking. She has never used smokeless tobacco. She reports current alcohol use. She reports that she does not use drugs.  Allergies   Allergen Reactions   Codeine Nausea And Vomiting    sensitive Pt is able to take as long as she eats prior to taking.    Family History  Problem Relation Age of Onset   Hypertension Mother    Diabetes Mother    Hypercholesterolemia Mother    Heart failure Father    Breast cancer Neg Hx     Prior to Admission medications   Medication Sig Start Date End Date Taking? Authorizing Provider  amLODipine (NORVASC) 5 MG tablet Take by mouth. 07/24/17   [provider]  aspirin 81 MG chewable tablet Chew by mouth daily.    [provider]  atorvastatin (LIPITOR) 10 MG tablet Take by mouth. 11/14/17 11/14/18  [provider]  enalapril (VASOTEC) 5 MG tablet  12/09/17   [provider]  ibuprofen (ADVIL,MOTRIN) 200 MG tablet Take 1-2 tablets by mouth three times a day as needed for pain.    [provider]  INTRAROSA 6.5 MG INST  12/10/17   [provider]  metFORMIN (GLUCOPHAGE) 500 MG tablet Take by mouth. 11/14/17 11/14/18  [provider]  ondansetron  (ZOFRAN -ODT) 4 MG disintegrating tablet Take 1 tablet (4 mg total) by mouth every 8 (eight) hours as needed for nausea or vomiting. 02/14/23   Saunders Shona CROME, PA-C  oxyCODONE -acetaminophen  (PERCOCET/ROXICET) 5-325 MG tablet Take 1 tablet by mouth every 6 (six) hours as needed for severe pain or moderate pain. Do not drive while taking as can cause drowsiness. 02/14/23   Saunders Shona CROME,  PA-C  Prasterone (INTRAROSA) 6.5 MG INST Place vaginally. 07/24/17   [provider]  predniSONE  (DELTASONE ) 10 MG tablet Start 60 mg po day one, then 50 mg po day two, taper by 10 mg daily until complete. Patient not taking: Reported on 03/31/2018 01/12/18   Cleotilde Jacobsen, NP    Physical Exam: Vitals:   03/02/24 0912 03/02/24 1030 03/02/24 1224 03/02/24 1239  BP: (!) 175/72 (!) 159/78 (!) 154/83 (!) 163/80  Pulse: 71 74 86 92  Resp: 15  18 15   Temp: 98.6 F (37 C)  (!) 97.5 F (36.4 C) 97.8 F  (36.6 C)  TempSrc: Oral   Oral  SpO2: 100% 98% 99% 99%   Physical Exam HENT:     Head: Normocephalic.   Eyes:     General: Lids are normal.     Conjunctiva/sclera: Conjunctivae normal.    Cardiovascular:     Rate and Rhythm: Normal rate and regular rhythm.     Heart sounds: Normal heart sounds, S1 normal and S2 normal.  Pulmonary:     Breath sounds: No decreased breath sounds, wheezing, rhonchi or rales.  Abdominal:     Palpations: Abdomen is soft.     Tenderness: There is no abdominal tenderness.   Musculoskeletal:     Right lower leg: No swelling.     Left lower leg: No swelling.   Skin:    General: Skin is warm.     Findings: No rash.   Neurological:     Mental Status: She is alert and oriented to person, place, and time.     Comments: Good sensation feet    Data Reviewed: EKG ordered. Creatinine 1.19 electrolytes normal, glucose 191, GFR 51, white blood count 12.0, hemoglobin 12.7, platelets 220 Assessment and Plan: * Closed right hip fracture, initial encounter [facility]) Orthopedic surgery to take to the operating room.  EKG ordered patient had a mechanical fall.  No contraindications to surgery at this time.  Pain control with IV and oral medications.  Changed to Tylenol  as quickly as possible.  Nausea & vomiting After IV pain medications.  Try to switch to Tylenol  as quickly as possible.  As needed Zofran  and 1 dose of Phenergan ordered.  Type 2 diabetes mellitus with chronic kidney disease, without long-term current use of insulin (HCC) CKD stage IIIa.  Gentle IV fluid hydration.  Will place on sliding scale.  Essential hypertension Continue Norvasc, Cozaar and Toprol  Hyperlipidemia, unspecified Continue Lipitor      Advance Care Planning:   Code Status: Full Code   Consults: Orthopedic surgery  Family Communication: Husband at bedside and family on the phone  Severity of Illness: The appropriate patient status for this patient is INPATIENT.  Inpatient status is judged to be reasonable and necessary in order to provide the required intensity of service to ensure the patient's safety. The patient's presenting symptoms, physical exam findings, and initial radiographic and laboratory data in the context of their chronic comorbidities is felt to place them at high risk for further clinical deterioration. Furthermore, it is not anticipated that the patient will be medically stable for discharge from the hospital within 2 midnights of admission.   * I certify that at the point of admission it is my clinical judgment that the patient will require inpatient hospital care spanning beyond 2 midnights from the point of admission due to high intensity of service, high risk for further deterioration and high frequency of surveillance required.*  Author: Charlie Sittner, MD  03/02/2024 2:31 PM  For on call review [email or web address].

## 2024-03-02 NOTE — Anesthesia Procedure Notes (Signed)
 Procedure Name: Intubation Date/Time: 03/02/2024 4:37 PM  Performed by: Norleen Alberta HERO., CRNAPre-anesthesia Checklist: Patient identified, Patient being monitored, Timeout performed, Emergency Drugs available and Suction available Patient Re-evaluated:Patient Re-evaluated prior to induction Oxygen Delivery Method: Circle system utilized Preoxygenation: Pre-oxygenation with 100% oxygen Induction Type: IV induction and Rapid sequence Laryngoscope Size: 3 and McGrath Grade View: Grade I Tube type: Oral Tube size: 6.5 mm Number of attempts: 1 Airway Equipment and Method: Stylet Placement Confirmation: ETT inserted through vocal cords under direct vision, positive ETCO2 and breath sounds checked- equal and bilateral Secured at: 20 cm Tube secured with: Tape Dental Injury: Teeth and Oropharynx as per pre-operative assessment

## 2024-03-03 ENCOUNTER — Encounter: Payer: Self-pay | Admitting: Surgery

## 2024-03-03 DIAGNOSIS — D72829 Elevated white blood cell count, unspecified: Secondary | ICD-10-CM | POA: Insufficient documentation

## 2024-03-03 DIAGNOSIS — E1122 Type 2 diabetes mellitus with diabetic chronic kidney disease: Secondary | ICD-10-CM | POA: Diagnosis not present

## 2024-03-03 DIAGNOSIS — S72001S Fracture of unspecified part of neck of right femur, sequela: Secondary | ICD-10-CM | POA: Diagnosis not present

## 2024-03-03 DIAGNOSIS — I10 Essential (primary) hypertension: Secondary | ICD-10-CM | POA: Diagnosis not present

## 2024-03-03 DIAGNOSIS — R112 Nausea with vomiting, unspecified: Secondary | ICD-10-CM | POA: Diagnosis not present

## 2024-03-03 DIAGNOSIS — D649 Anemia, unspecified: Secondary | ICD-10-CM | POA: Insufficient documentation

## 2024-03-03 LAB — CBC
HCT: 31 % — ABNORMAL LOW (ref 36.0–46.0)
Hemoglobin: 10.5 g/dL — ABNORMAL LOW (ref 12.0–15.0)
MCH: 30.3 pg (ref 26.0–34.0)
MCHC: 33.9 g/dL (ref 30.0–36.0)
MCV: 89.3 fL (ref 80.0–100.0)
Platelets: 221 10*3/uL (ref 150–400)
RBC: 3.47 MIL/uL — ABNORMAL LOW (ref 3.87–5.11)
RDW: 12.4 % (ref 11.5–15.5)
WBC: 15.9 10*3/uL — ABNORMAL HIGH (ref 4.0–10.5)
nRBC: 0 % (ref 0.0–0.2)

## 2024-03-03 LAB — URINALYSIS, W/ REFLEX TO CULTURE (INFECTION SUSPECTED)
Bilirubin Urine: NEGATIVE
Glucose, UA: NEGATIVE mg/dL
Ketones, ur: NEGATIVE mg/dL
Nitrite: NEGATIVE
Protein, ur: NEGATIVE mg/dL
Specific Gravity, Urine: 1.011 (ref 1.005–1.030)
pH: 5 (ref 5.0–8.0)

## 2024-03-03 LAB — HIV ANTIBODY (ROUTINE TESTING W REFLEX): HIV Screen 4th Generation wRfx: NONREACTIVE

## 2024-03-03 LAB — GLUCOSE, CAPILLARY
Glucose-Capillary: 174 mg/dL — ABNORMAL HIGH (ref 70–99)
Glucose-Capillary: 113 mg/dL — ABNORMAL HIGH (ref 70–99)
Glucose-Capillary: 107 mg/dL — ABNORMAL HIGH (ref 70–99)
Glucose-Capillary: 110 mg/dL — ABNORMAL HIGH (ref 70–99)

## 2024-03-03 LAB — BASIC METABOLIC PANEL WITH GFR
Anion gap: 6 (ref 5–15)
BUN: 22 mg/dL (ref 8–23)
CO2: 23 mmol/L (ref 22–32)
Calcium: 8.1 mg/dL — ABNORMAL LOW (ref 8.9–10.3)
Chloride: 109 mmol/L (ref 98–111)
Creatinine, Ser: 1.23 mg/dL — ABNORMAL HIGH (ref 0.44–1.00)
GFR, Estimated: 49 mL/min — ABNORMAL LOW (ref >60)
Glucose, Bld: 170 mg/dL — ABNORMAL HIGH (ref 70–99)
Potassium: 4.1 mmol/L (ref 3.5–5.1)
Sodium: 138 mmol/L (ref 135–145)

## 2024-03-03 MED ORDER — FAMOTIDINE IN NACL 20-0.9 MG/50ML-% IV SOLN
20.0000 mg | Freq: Once | INTRAVENOUS | Status: DC
  Filled 2024-03-03: qty 50

## 2024-03-03 MED ORDER — CALCIUM CARBONATE ANTACID 500 MG PO CHEW
2.0000 | CHEWABLE_TABLET | Freq: Once | ORAL | Status: AC
  Administered 2024-03-03: 400 mg via ORAL
  Filled 2024-03-03: qty 2

## 2024-03-03 MED ORDER — POLYETHYLENE GLYCOL 3350 17 G PO PACK
17.0000 g | PACK | Freq: Every day | ORAL | Status: DC
  Administered 2024-03-03: 17 g via ORAL
  Filled 2024-03-03 (×3): qty 1

## 2024-03-03 MED ORDER — FAMOTIDINE 20 MG PO TABS
20.0000 mg | ORAL_TABLET | Freq: Every day | ORAL | Status: DC
  Administered 2024-03-03 – 2024-03-05 (×3): 20 mg via ORAL
  Filled 2024-03-03 (×3): qty 1

## 2024-03-03 MED ORDER — OXYCODONE HCL 5 MG PO TABS: 5.0000 mg | ORAL_TABLET | ORAL | Status: DC | PRN

## 2024-03-03 NOTE — TOC Initial Note (Signed)
 Transition of Care [facility]) - Initial/Assessment Note    Patient Details  Name: Monica Nixon MRN: [id number] Date of Birth: May 07, 1956  Transition of Care [facility] CM/SW Contact:    Quintella Suzen Jansky, RN Phone Number: 03/03/2024, 8:53 AM  Clinical Narrative:                  Patient admitted for fall at home. S/P Reduction and internal fixation of displaced intertrochanteric right hip fracture with Biomet Affixis TFN nail with ortho. PT/OT pending evals. TOC will follow for discharge planning needs.       Patient Goals and CMS Choice            Expected Discharge Plan and Services                                              Prior Living Arrangements/Services                       Activities of Daily Living   ADL Screening (condition at time of admission) Independently performs ADLs?: Yes (appropriate for developmental age) Is the patient deaf or have difficulty hearing?: No Does the patient have difficulty seeing, even when wearing glasses/contacts?: No Does the patient have difficulty concentrating, remembering, or making decisions?: No  Permission Sought/Granted                  Emotional Assessment              Admission diagnosis:  Closed right hip fracture, initial encounter (HCC) [S72.001A] Patient Active Problem List   Diagnosis Date Noted   Closed right hip fracture, initial encounter (HCC) 03/02/2024   Nausea & vomiting 03/02/2024   Type 2 diabetes mellitus with chronic kidney disease, without long-term current use of insulin (HCC) 03/02/2024   Essential hypertension 03/02/2024   Hyperlipidemia, unspecified 03/02/2024   PCP:  Auston Reyes BIRCH, MD Pharmacy:   [facility] [address] [place] - [address] [place] [address] Phone: [phone] Fax: [phone]     Social Drivers of Health (SDOH) Social History: SDOH Screenings   Food Insecurity: No Food Insecurity (03/02/2024)   Housing: Low Risk  (03/02/2024)  Transportation Needs: No Transportation Needs (03/02/2024)  Utilities: Not At Risk (03/02/2024)  Financial Resource Strain: Low Risk  (08/13/2023)   Received from [facility]  Social Connections: Unknown (03/02/2024)  Tobacco Use: Medium Risk (03/02/2024)   SDOH Interventions:     Readmission Risk Interventions     No data to display

## 2024-03-03 NOTE — Hospital Course (Signed)
 68 y.o. female with medical history significant of type 2 diabetes mellitus hypertension hyperlipidemia and prior stroke.  She presents to the hospital after going up steps on the desk she lost her balance and had a fall on her hip.  Came in for further evaluation.  In the ER she was found to have a displaced right hip fracture.  Hospitalist service was contacted for further evaluation.  Dr. Edie  took to the operating room and performed a right reduction and internal fixation of displaced intertrochanteric right hip fracture with Biomet FXS TFN nail. Patient doing well since surgery, transfer patient to nursing home for rehab.

## 2024-03-03 NOTE — Evaluation (Signed)
 Physical Therapy Evaluation Patient Details Name: Yomara Toothman MRN: [id number] DOB: 03-06-1956 Today's Date: 03/03/2024  History of Present Illness  Patient is a 68 year old female with fall resulting in displaced intertrochanteric fracture right hip, s/p IM nail. History of HTN, hypercholesterolemia, diabetes, and SVT  Clinical Impression  Patient is agreeable to PT evaluation. She is independent at baseline and lives with her spouse. She has a multi-level home and stairs are required.  Today the patient reports mild pain and nausea with mobility. Mobility and gait training initiated. Patient walked 73ft with cues for sequencing using rolling walker. Anticipate patient will require intermittent physical assistance with mobility after this hospital stay. Consider rehabilitation < 3 hours/day after this hospital stay if patient does not have adequate caregiver support. PT will continue to follow.       If plan is discharge home, recommend the following: A little help with walking and/or transfers;A little help with bathing/dressing/bathroom;Help with stairs or ramp for entrance;Assistance with cooking/housework   Can travel by private vehicle   Yes    Equipment Recommendations BSC/3in1  Recommendations for Other Services       Functional Status Assessment Patient has had a recent decline in their functional status and demonstrates the ability to make significant improvements in function in a reasonable and predictable amount of time.     Precautions / Restrictions Precautions Precautions: Fall Recall of Precautions/Restrictions: Intact Restrictions Weight Bearing Restrictions Per Provider Order: Yes RLE Weight Bearing Per Provider Order: Weight bearing as tolerated      Mobility  Bed Mobility Overal bed mobility: Needs Assistance Bed Mobility: Supine to Sit     Supine to sit: Min assist, HOB elevated     General bed mobility comments: assistance required for RLE support.  verbal cues for technique    Transfers Overall transfer level: Needs assistance Equipment used: Rolling walker (2 wheels) Transfers: Sit to/from Stand Sit to Stand: Contact guard assist           General transfer comment: cues for hand placement and RLE positioning with sitting for comfort and safety    Ambulation/Gait Ambulation/Gait assistance: Contact guard assist Gait Distance (Feet): 121 Feet Assistive device: Rolling walker (2 wheels) Gait Pattern/deviations: Decreased step length - right, Decreased stride length, Antalgic Gait velocity: decreased     General Gait Details: cues for rolling walker and BLE sequencing. mild nausea after walking with no vomiting  Stairs            Wheelchair Mobility     Tilt Bed    Modified Rankin (Stroke Patients Only)       Balance Overall balance assessment: Needs assistance Sitting-balance support: Feet supported Sitting balance-Leahy Scale: Good     Standing balance support: Bilateral upper extremity supported Standing balance-Leahy Scale: Fair Standing balance comment: using rolling walker for support in standing                             Pertinent Vitals/Pain Pain Assessment Pain Assessment: 0-10 Pain Score: 1  Pain Location: R hip Pain Descriptors / Indicators: Discomfort Pain Intervention(s): Limited activity within patient's tolerance, Monitored during session, RN gave pain meds during session, Ice applied    Home Living Family/patient expects to be discharged to:: Private residence Living Arrangements: Spouse/significant other Available Help at Discharge: Family;Available PRN/intermittently Type of Home: House Home Access: Stairs to enter Entrance Stairs-Rails: Right;Left Entrance Stairs-Number of Steps: 4 Alternate Level Stairs-Number of Steps: 6  Home Layout: Multi-level (bed/bath down 6 steps, living area on another level) Home Equipment: Grab bars - tub/shower;Rolling Walker (2  wheels)      Prior Function Prior Level of Function : Independent/Modified Independent             Mobility Comments: independent with ambulation. she keeps her grand children in the summertime ADLs Comments: independent     Extremity/Trunk Assessment   Upper Extremity Assessment Upper Extremity Assessment: Overall WFL for tasks assessed    Lower Extremity Assessment Lower Extremity Assessment: RLE deficits/detail;LLE deficits/detail RLE Deficits / Details: pain with active movement of R hip. can activate ankle movement. unable to SLR. no knee buckling with weight bearing RLE Sensation: WNL LLE Deficits / Details: WFL       Communication   Communication Communication: No apparent difficulties    Cognition Arousal: Alert Behavior During Therapy: WFL for tasks assessed/performed   PT - Cognitive impairments: No apparent impairments                         Following commands: Intact       Cueing Cueing Techniques: Verbal cues, Tactile cues, Visual cues     General Comments      Exercises     Assessment/Plan    PT Assessment Patient needs continued PT services  PT Problem List Decreased strength;Decreased range of motion;Decreased activity tolerance;Decreased balance;Decreased mobility;Decreased safety awareness;Pain       PT Treatment Interventions DME instruction;Gait training;Stair training;Functional mobility training;Therapeutic activities;Therapeutic exercise;Balance training;Patient/family education    PT Goals (Current goals can be found in the Care Plan section)  Acute Rehab PT Goals Patient Stated Goal: possible rehab versus home depending on progress PT Goal Formulation: With patient Time For Goal Achievement: 03/17/24 Potential to Achieve Goals: Good    Frequency 7X/week     Co-evaluation               AM-PAC PT 6 Clicks Mobility  Outcome Measure Help needed turning from your back to your side while in a flat bed  without using bedrails?: A Little Help needed moving from lying on your back to sitting on the side of a flat bed without using bedrails?: A Little Help needed moving to and from a bed to a chair (including a wheelchair)?: A Little Help needed standing up from a chair using your arms (e.g., wheelchair or bedside chair)?: A Little Help needed to walk in hospital room?: A Little Help needed climbing 3-5 steps with a railing? : A Little 6 Click Score: 18    End of Session Equipment Utilized During Treatment: Gait belt Activity Tolerance: Patient tolerated treatment well Patient left: in chair;with call bell/phone within reach;with chair alarm set (ice applied R hip) Nurse Communication: Mobility status PT Visit Diagnosis: Difficulty in walking, not elsewhere classified (R26.2);Other abnormalities of gait and mobility (R26.89)    Time: 9074-9042 PT Time Calculation (min) (ACUTE ONLY): 32 min   Charges:   PT Evaluation $PT Eval Moderate Complexity: 1 Mod PT Treatments $Gait Training: 8-22 mins PT General Charges $$ ACUTE PT VISIT: 1 Visit        Randine Essex, PT, MPT   Randine LULLA Essex 03/03/2024, 10:10 AM

## 2024-03-03 NOTE — Plan of Care (Signed)
  Problem: Clinical Measurements: Goal: Ability to maintain clinical measurements within normal limits will improve Outcome: Progressing Goal: Will remain free from infection Outcome: Progressing Goal: Diagnostic test results will improve Outcome: Progressing Goal: Respiratory complications will improve Outcome: Progressing Goal: Cardiovascular complication will be avoided Outcome: Progressing   Problem: Activity: Goal: Risk for activity intolerance will decrease Outcome: Progressing   Problem: Nutrition: Goal: Adequate nutrition will be maintained Outcome: Progressing   Problem: Coping: Goal: Level of anxiety will decrease Outcome: Progressing   Problem: Elimination: Goal: Will not experience complications related to bowel motility Outcome: Progressing Goal: Will not experience complications related to urinary retention Outcome: Progressing   Problem: Pain Managment: Goal: General experience of comfort will improve and/or be controlled Outcome: Progressing   Problem: Safety: Goal: Ability to remain free from injury will improve Outcome: Progressing   Problem: Skin Integrity: Goal: Risk for impaired skin integrity will decrease Outcome: Progressing   Problem: Education: Goal: Ability to describe self-care measures that may prevent or decrease complications (Diabetes Survival Skills Education) will improve Outcome: Progressing Goal: Individualized Educational Video(s) Outcome: Progressing   Problem: Coping: Goal: Ability to adjust to condition or change in health will improve Outcome: Progressing   Problem: Fluid Volume: Goal: Ability to maintain a balanced intake and output will improve Outcome: Progressing   Problem: Health Behavior/Discharge Planning: Goal: Ability to identify and utilize available resources and services will improve Outcome: Progressing Goal: Ability to manage health-related needs will improve Outcome: Progressing   Problem:  Metabolic: Goal: Ability to maintain appropriate glucose levels will improve Outcome: Progressing   Problem: Nutritional: Goal: Maintenance of adequate nutrition will improve Outcome: Progressing Goal: Progress toward achieving an optimal weight will improve Outcome: Progressing   Problem: Skin Integrity: Goal: Risk for impaired skin integrity will decrease Outcome: Progressing   Problem: Tissue Perfusion: Goal: Adequacy of tissue perfusion will improve Outcome: Progressing

## 2024-03-03 NOTE — Assessment & Plan Note (Signed)
 Could be reactive from fracture and surgery.  Will send off a urinalysis.  Chest x-ray negative.

## 2024-03-03 NOTE — Assessment & Plan Note (Signed)
 Hemoglobin 10.5 down from 12.7.  Likely secondary to IV fluid hydration and surgery.  Discontinue fluids.

## 2024-03-03 NOTE — Progress Notes (Signed)
  Subjective: 1 Day Post-Op Procedure(s) (LRB): FIXATION, FRACTURE, INTERTROCHANTERIC, WITH INTRAMEDULLARY ROD (Right) Patient reports pain as mild.   Patient is well, and has had no acute complaints or problems PT and care management to assist with discharge planning. Negative for chest pain and shortness of breath Fever: no Gastrointestinal:Negative for vomiting.  Did have some nausea last night and this morning.  Objective: Vital signs in last 24 hours: Temp:  [97.6 F (36.4 C)-99.5 F (37.5 C)] 97.6 F (36.4 C) (06/24 1510) Pulse Rate:  [84-97] 84 (06/24 1510) Resp:  [18] 18 (06/24 1510) BP: (140-149)/(63-74) 142/63 (06/24 1510) SpO2:  [99 %-100 %] 100 % (06/24 1510)  Intake/Output from previous day:  Intake/Output Summary (Last 24 hours) at 03/03/2024 1937 Last data filed at 03/03/2024 0900 Gross per 24 hour  Intake 789.93 ml  Output 330 ml  Net 459.93 ml    Intake/Output this shift: No intake/output data recorded.  Labs: Recent Labs    03/02/24 1132 03/03/24 0503  HGB 12.7 10.5*   Recent Labs    03/02/24 1132 03/03/24 0503  WBC 12.0* 15.9*  RBC 4.24 3.47*  HCT 37.8 31.0*  PLT 225 221   Recent Labs    03/02/24 1132 03/03/24 0503  NA 137 138  K 4.0 4.1  CL 106 109  CO2 26 23  BUN 18 22  CREATININE 1.19* 1.23*  GLUCOSE 191* 170*  CALCIUM 8.9 8.1*   No results for input(s): LABPT, INR in the last 72 hours.   EXAM General - Patient is Alert, Appropriate, and Oriented Extremity - ABD soft Neurovascular intact Dorsiflexion/Plantar flexion intact Incision: dressing C/D/I No cellulitis present Compartment soft Dressing/Incision - clean, dry, no drainage to the right hip honeycomb dressings. Motor Function - intact, moving foot and toes well on exam.  Abdomen soft with intact bowel sounds this AM.  Past Medical History:  Diagnosis Date   Diabetes mellitus without complication (HCC)    Hypercholesteremia    Hypertension    Stroke (HCC)     SVT (supraventricular tachycardia) (HCC)     Assessment/Plan: 1 Day Post-Op Procedure(s) (LRB): FIXATION, FRACTURE, INTERTROCHANTERIC, WITH INTRAMEDULLARY ROD (Right) Principal Problem:   Closed right hip fracture, sequela Active Problems:   Nausea & vomiting   Type 2 diabetes mellitus with chronic kidney disease, without long-term current use of insulin (HCC)   Essential hypertension   Hyperlipidemia, unspecified   Leukocytosis   Postoperative anemia  Estimated body mass index is 24.37 kg/m as calculated from the following:   Height as of this encounter: 5' 6 (1.676 m).   Weight as of this encounter: 68.5 kg. Advance diet Up with therapy D/C IV fluids when tolerating po intake.  Labs and vitals reviewed this AM. Up with PT. Continue to work on a BM. SNF vs. HHPT at discharge.  DVT Prophylaxis - Lovenox and TED hose Weight-Bearing as tolerated to right leg  J. Gustavo Level, PA-C [facility] 03/03/2024, 7:37 PM

## 2024-03-03 NOTE — Progress Notes (Signed)
 Progress Note   Patient: Monica Nixon FMW:[id number] DOB: 17-Nov-1955 DOA: 03/02/2024     1 DOS: the patient was seen and examined on 03/03/2024   Brief hospital course: 68 y.o. female with medical history significant of type 2 diabetes mellitus hypertension hyperlipidemia and prior stroke.  She presents to the hospital after going up steps on the desk she lost her balance and had a fall on her hip.  Came in for further evaluation.  In the ER she was found to have a displaced right hip fracture.  Hospitalist service was contacted for further evaluation.  Dr. Edie  took to the operating room and performed a right reduction and internal fixation of displaced intertrochanteric right hip fracture with Biomet FXS TFN nail.  6/24.  Patient did well with physical therapy postoperative day 1.  Assessment and Plan: * Closed right hip fracture, sequela Postoperative day 1 for reduction internal fixation of displaced right intertrochanteric fracture with Biomet FXS TFN nail.  Continue working with physical therapy.  Pain control.  With her best to switch over to Tylenol  as quickly as possible.  Patient states she is able to tolerate Toradol.  Nausea & vomiting After IV pain medications.  Try to switch to Tylenol  as quickly as possible.  Discontinue IV Dilaudid.  Type 2 diabetes mellitus with chronic kidney disease, without long-term current use of insulin (HCC) CKD stage IIIa.  Continue sliding scale.  Essential hypertension Continue Norvasc, Cozaar and Toprol  Hyperlipidemia, unspecified Continue Lipitor  Postoperative anemia Hemoglobin 10.5 down from 12.7.  Likely secondary to IV fluid hydration and surgery.  Discontinue fluids.  Leukocytosis Could be reactive from fracture and surgery.  Will send off a urinalysis.  Chest x-ray negative.        Subjective: Patient did better with Toradol then with the IV pain medications.  Discontinue IV pain medications.  Try to convert to Tylenol  as  quickly as possible.  Walked well with physical therapy on postoperative day 1.  Admitted with hip fracture.  Physical Exam: Vitals:   03/02/24 1915 03/02/24 2103 03/03/24 0457 03/03/24 0732  BP: (!) 140/76 (!) 149/74 (!) 147/64 (!) 140/67  Pulse: 85 94 97 90  Resp: 13 18 18 18   Temp:  99.5 F (37.5 C) 99.1 F (37.3 C) 98 F (36.7 C)  TempSrc:  Oral Oral Oral  SpO2: 98% 100% 99% 100%  Weight:      Height:       Physical Exam HENT:     Head: Normocephalic.   Eyes:     General: Lids are normal.     Conjunctiva/sclera: Conjunctivae normal.    Cardiovascular:     Rate and Rhythm: Normal rate and regular rhythm.     Heart sounds: Normal heart sounds, S1 normal and S2 normal.  Pulmonary:     Breath sounds: No decreased breath sounds, wheezing, rhonchi or rales.  Abdominal:     Palpations: Abdomen is soft.     Tenderness: There is no abdominal tenderness.   Musculoskeletal:     Right lower leg: No swelling.     Left lower leg: No swelling.   Skin:    General: Skin is warm.     Findings: No rash.   Neurological:     Mental Status: She is alert and oriented to person, place, and time.     Comments: Unable to straight leg raise right leg.    Data Reviewed: Creatinine 1.23 with a GFR 49, white blood count 15.9, hemoglobin  10.5, platelet count 221  Family Communication: Left message for husband  Disposition: Status is: Inpatient Remains inpatient appropriate because: Postoperative day 1 for right hip fracture repair  Planned Discharge Destination: Rehab versus home health depending on progress    Time spent: 28 minutes  Author: Charlie Netherton, MD 03/03/2024 12:34 PM  For on call review [email or web address].

## 2024-03-04 DIAGNOSIS — D62 Acute posthemorrhagic anemia: Secondary | ICD-10-CM | POA: Diagnosis not present

## 2024-03-04 DIAGNOSIS — I10 Essential (primary) hypertension: Secondary | ICD-10-CM | POA: Diagnosis not present

## 2024-03-04 DIAGNOSIS — N1831 Chronic kidney disease, stage 3a: Secondary | ICD-10-CM | POA: Insufficient documentation

## 2024-03-04 DIAGNOSIS — S72001S Fracture of unspecified part of neck of right femur, sequela: Secondary | ICD-10-CM | POA: Diagnosis not present

## 2024-03-04 LAB — CBC
HCT: 27.8 % — ABNORMAL LOW (ref 36.0–46.0)
Hemoglobin: 9.2 g/dL — ABNORMAL LOW (ref 12.0–15.0)
MCH: 30 pg (ref 26.0–34.0)
MCHC: 33.1 g/dL (ref 30.0–36.0)
MCV: 90.6 fL (ref 80.0–100.0)
Platelets: 181 10*3/uL (ref 150–400)
RBC: 3.07 MIL/uL — ABNORMAL LOW (ref 3.87–5.11)
RDW: 12.8 % (ref 11.5–15.5)
WBC: 10.3 10*3/uL (ref 4.0–10.5)
nRBC: 0 % (ref 0.0–0.2)

## 2024-03-04 LAB — GLUCOSE, CAPILLARY
Glucose-Capillary: 89 mg/dL (ref 70–99)
Glucose-Capillary: 102 mg/dL — ABNORMAL HIGH (ref 70–99)
Glucose-Capillary: 105 mg/dL — ABNORMAL HIGH (ref 70–99)
Glucose-Capillary: 114 mg/dL — ABNORMAL HIGH (ref 70–99)

## 2024-03-04 LAB — BASIC METABOLIC PANEL WITH GFR
Anion gap: 6 (ref 5–15)
BUN: 31 mg/dL — ABNORMAL HIGH (ref 8–23)
CO2: 23 mmol/L (ref 22–32)
Calcium: 8.6 mg/dL — ABNORMAL LOW (ref 8.9–10.3)
Chloride: 111 mmol/L (ref 98–111)
Creatinine, Ser: 1.51 mg/dL — ABNORMAL HIGH (ref 0.44–1.00)
GFR, Estimated: 38 mL/min — ABNORMAL LOW (ref >60)
Glucose, Bld: 80 mg/dL (ref 70–99)
Potassium: 4.5 mmol/L (ref 3.5–5.1)
Sodium: 140 mmol/L (ref 135–145)

## 2024-03-04 MED ORDER — ACETAMINOPHEN 500 MG PO TABS
1000.0000 mg | ORAL_TABLET | Freq: Four times a day (QID) | ORAL | Status: DC
Start: 2024-03-04 — End: 2024-03-05
  Administered 2024-03-04 – 2024-03-05 (×5): 1000 mg via ORAL
  Filled 2024-03-04 (×5): qty 2

## 2024-03-04 NOTE — TOC Progression Note (Signed)
 Transition of Care [facility]) - Progression Note    Patient Details  Name: Monica Nixon MRN: [id number] Date of Birth: 1956/04/06  [facility]) CM/SW Contact  Quintella Suzen Jansky, RN Phone Number: 03/04/2024, 10:20 AM  Clinical Narrative:     Met with patient at bedside, discussed discharge recommendations. She is agreeable to short-term rehab. She would like to go to [facility], [address] of [place], [facility], or [facility].  PASRR, FL2 pending MD signature, and bed search completed. Patient is pending bed offers.        Expected Discharge Plan and Services                                               Social Determinants of Health (SDOH) Interventions SDOH Screenings   Food Insecurity: No Food Insecurity (03/02/2024)  Housing: Low Risk  (03/02/2024)  Transportation Needs: No Transportation Needs (03/02/2024)  Utilities: Not At Risk (03/02/2024)  Financial Resource Strain: Low Risk  (08/13/2023)   Received from [facility]  Social Connections: Unknown (03/02/2024)  Tobacco Use: Medium Risk (03/02/2024)    Readmission Risk Interventions     No data to display

## 2024-03-04 NOTE — Progress Notes (Signed)
 Occupational Therapy Evaluation Patient Details Name: Monica Nixon MRN: [id number] DOB: May 25, 1956 Today's Date: 03/04/2024   History of Present Illness   Patient is a 68 year old female with fall resulting in displaced intertrochanteric fracture right hip, s/p IM nail. History of HTN, hypercholesterolemia, diabetes, and SVT     Clinical Impressions Ms Grall was seen for OT evaluation this date. Prior to hospital admission, pt was IND in ADLs. Pt lives w/ spouse. Pt presents to acute OT demonstrating impaired ADL performance and functional mobility 2/2 impaired standing balance and decreased activity tolerance (See OT problem list for additional functional deficits). Pt currently requires MIN A for bed mobility, CGA + RW for STS. Pt ambulated to bathroom with SBA + RW, was SUPERVISION for toileting, MAX A for pericare. Orthostatic vitals measured due to nausea and diaphoretic during mobility with PT.  Orthostatic VS for the past 24 hrs:  BP- Lying Pulse- Lying BP- Sitting Pulse- Sitting BP- Standing at 0 minutes Pulse- Standing at 0 minutes BP-Standing 3 minutes Pulse-standing at 3 min  03/04/24 1328 161/49 71 161/61 84 147/71 96 123/62 108      Pt would benefit from skilled OT services to address noted impairments and functional limitations (see below for any additional details) in order to maximize safety and independence while minimizing falls risk and caregiver burden. Anticipate the need for follow up OT services upon acute hospital DC.      If plan is discharge home, recommend the following:   A little help with walking and/or transfers;A little help with bathing/dressing/bathroom;Assistance with cooking/housework;Assist for transportation;Help with stairs or ramp for entrance     Functional Status Assessment   Patient has had a recent decline in their functional status and demonstrates the ability to make significant improvements in function in a reasonable and  predictable amount of time.     Equipment Recommendations   Other (comment) (defer to next venue of care)     Recommendations for Other Services         Precautions/Restrictions   Precautions Precautions: Fall Recall of Precautions/Restrictions: Intact Restrictions Weight Bearing Restrictions Per Provider Order: Yes RLE Weight Bearing Per Provider Order: Weight bearing as tolerated     Mobility Bed Mobility Overal bed mobility: Needs Assistance Bed Mobility: Supine to Sit     Supine to sit: Min assist, HOB elevated          Transfers Overall transfer level: Needs assistance Equipment used: Rolling walker (2 wheels) Transfers: Sit to/from Stand Sit to Stand: Contact guard assist                  Balance Overall balance assessment: Needs assistance Sitting-balance support: Feet supported Sitting balance-Leahy Scale: Good     Standing balance support: During functional activity, No upper extremity supported Standing balance-Leahy Scale: Fair                             ADL either performed or assessed with clinical judgement   ADL Overall ADL's : Needs assistance/impaired                                       General ADL Comments: SUPERVISION for toileting, MAX A for pericare     Vision         Perception         Praxis  Pertinent Vitals/Pain Pain Assessment Pain Assessment: 0-10 Pain Score: 4  Pain Location: R hip Pain Descriptors / Indicators: Discomfort, Sore Pain Intervention(s): Limited activity within patient's tolerance, Monitored during session, Repositioned     Extremity/Trunk Assessment Upper Extremity Assessment Upper Extremity Assessment: Overall WFL for tasks assessed   Lower Extremity Assessment Lower Extremity Assessment: Overall WFL for tasks assessed       Communication Communication Communication: No apparent difficulties   Cognition Arousal: Alert Behavior During  Therapy: WFL for tasks assessed/performed Cognition: No apparent impairments                               Following commands: Intact       Cueing  General Comments   Cueing Techniques: Verbal cues      Exercises     Shoulder Instructions      Home Living Family/patient expects to be discharged to:: Private residence Living Arrangements: Spouse/significant other Available Help at Discharge: Family;Available PRN/intermittently Type of Home: House Home Access: Stairs to enter [employer] of Steps: 4 Entrance Stairs-Rails: Right;Left Home Layout: Multi-level Alternate Level Stairs-Number of Steps: 6 Alternate Level Stairs-Rails: Right;Left Bathroom Shower/Tub: Producer, television/film/video: Handicapped height     Home Equipment: Grab bars - tub/shower;Rolling Environmental consultant (2 wheels)          Prior Functioning/Environment Prior Level of Function : Independent/Modified Independent               ADLs Comments: independent    OT Problem List: Decreased range of motion;Decreased activity tolerance;Impaired balance (sitting and/or standing)   OT Treatment/Interventions: Self-care/ADL training;Patient/family education      OT Goals(Current goals can be found in the care plan section)   Acute Rehab OT Goals Patient Stated Goal: to get stronger OT Goal Formulation: With patient Time For Goal Achievement: 03/18/24 Potential to Achieve Goals: Good ADL Goals Pt Will Perform Lower Body Dressing: with modified independence;sitting/lateral leans;sit to/from stand Pt Will Perform Tub/Shower Transfer: Shower transfer;with supervision;ambulating Additional ADL Goal #1: Pt will demonstrate standing tolerance of >10 min. for ADL/IADL task.   OT Frequency:  Min 2X/week    Co-evaluation              AM-PAC OT 6 Clicks Daily Activity     Outcome Measure Help from another person eating meals?: None Help from another person taking care of  personal grooming?: None Help from another person toileting, which includes using toliet, bedpan, or urinal?: A Lot Help from another person bathing (including washing, rinsing, drying)?: A Lot Help from another person to put on and taking off regular upper body clothing?: A Little Help from another person to put on and taking off regular lower body clothing?: A Lot 6 Click Score: 17   End of Session Equipment Utilized During Treatment: Gait belt;Rolling walker (2 wheels)  Activity Tolerance: Patient tolerated treatment well Patient left: in bed;with call bell/phone within reach;with bed alarm set  OT Visit Diagnosis: Unsteadiness on feet (R26.81);Other abnormalities of gait and mobility (R26.89)                Time: 8755-8687 OT Time Calculation (min): 28 min Charges:  OT General Charges $OT Visit: 1 Visit OT Evaluation $OT Eval Moderate Complexity: 1 Mod OT Treatments $Self Care/Home Management : 8-22 mins  [place], Student OT   [employer] 03/04/2024, 1:44 PM

## 2024-03-04 NOTE — Plan of Care (Signed)

## 2024-03-04 NOTE — Plan of Care (Signed)
  Problem: Clinical Measurements: Goal: Ability to maintain clinical measurements within normal limits will improve Outcome: Progressing Goal: Will remain free from infection Outcome: Progressing Goal: Diagnostic test results will improve Outcome: Progressing Goal: Respiratory complications will improve Outcome: Progressing Goal: Cardiovascular complication will be avoided Outcome: Progressing   Problem: Activity: Goal: Risk for activity intolerance will decrease Outcome: Progressing   Problem: Nutrition: Goal: Adequate nutrition will be maintained Outcome: Progressing   Problem: Coping: Goal: Level of anxiety will decrease Outcome: Progressing   Problem: Elimination: Goal: Will not experience complications related to bowel motility Outcome: Progressing Goal: Will not experience complications related to urinary retention Outcome: Progressing   Problem: Pain Managment: Goal: General experience of comfort will improve and/or be controlled Outcome: Progressing   Problem: Safety: Goal: Ability to remain free from injury will improve Outcome: Progressing   Problem: Skin Integrity: Goal: Risk for impaired skin integrity will decrease Outcome: Progressing   Problem: Education: Goal: Ability to describe self-care measures that may prevent or decrease complications (Diabetes Survival Skills Education) will improve Outcome: Progressing Goal: Individualized Educational Video(s) Outcome: Progressing   Problem: Coping: Goal: Ability to adjust to condition or change in health will improve Outcome: Progressing   Problem: Fluid Volume: Goal: Ability to maintain a balanced intake and output will improve Outcome: Progressing   Problem: Health Behavior/Discharge Planning: Goal: Ability to identify and utilize available resources and services will improve Outcome: Progressing Goal: Ability to manage health-related needs will improve Outcome: Progressing   Problem:  Metabolic: Goal: Ability to maintain appropriate glucose levels will improve Outcome: Progressing   Problem: Nutritional: Goal: Maintenance of adequate nutrition will improve Outcome: Progressing Goal: Progress toward achieving an optimal weight will improve Outcome: Progressing   Problem: Skin Integrity: Goal: Risk for impaired skin integrity will decrease Outcome: Progressing   Problem: Tissue Perfusion: Goal: Adequacy of tissue perfusion will improve Outcome: Progressing

## 2024-03-04 NOTE — Progress Notes (Signed)
 Subjective: 2 Days Post-Op Procedure(s) (LRB): FIXATION, FRACTURE, INTERTROCHANTERIC, WITH INTRAMEDULLARY ROD (Right) Patient reports pain as mild in the right hip.  Patient is well, and has had no acute complaints or problems PT and care management to assist with discharge planning.  Due to multiple steps at home and family support, currrent plan is for possible discharge to SNF. Negative for chest pain and shortness of breath Fever: no Gastrointestinal:Negative for vomiting.  Nausea has improved. Patient has had a BM.  Objective: Vital signs in last 24 hours: Temp:  [97.6 F (36.4 C)-98.6 F (37 C)] 98.3 F (36.8 C) (06/25 0258) Pulse Rate:  [75-88] 75 (06/25 0258) Resp:  [17-20] 20 (06/25 0258) BP: (142-166)/(59-68) 166/68 (06/25 0258) SpO2:  [98 %-100 %] 100 % (06/25 0258)  Intake/Output from previous day:  Intake/Output Summary (Last 24 hours) at 03/04/2024 0751 Last data filed at 03/04/2024 0200 Gross per 24 hour  Intake 120 ml  Output --  Net 120 ml    Intake/Output this shift: No intake/output data recorded.  Labs: Recent Labs    03/02/24 1132 03/03/24 0503 03/04/24 0450  HGB 12.7 10.5* 9.2*   Recent Labs    03/03/24 0503 03/04/24 0450  WBC 15.9* 10.3  RBC 3.47* 3.07*  HCT 31.0* 27.8*  PLT 221 181   Recent Labs    03/03/24 0503 03/04/24 0450  NA 138 140  K 4.1 4.5  CL 109 111  CO2 23 23  BUN 22 31*  CREATININE 1.23* 1.51*  GLUCOSE 170* 80  CALCIUM 8.1* 8.6*   No results for input(s): LABPT, INR in the last 72 hours.   EXAM General - Patient is Alert, Appropriate, and Oriented Extremity - ABD soft Neurovascular intact Dorsiflexion/Plantar flexion intact Incision: dressing C/D/I No cellulitis present Compartment soft Dressing/Incision - clean, dry, no drainage to the right hip honeycomb dressings. Motor Function - intact, moving foot and toes well on exam.  Abdomen soft with intact bowel sounds this AM.  Past Medical History:   Diagnosis Date   Diabetes mellitus without complication (HCC)    Hypercholesteremia    Hypertension    Stroke (HCC)    SVT (supraventricular tachycardia) (HCC)     Assessment/Plan: 2 Days Post-Op Procedure(s) (LRB): FIXATION, FRACTURE, INTERTROCHANTERIC, WITH INTRAMEDULLARY ROD (Right) Principal Problem:   Closed right hip fracture, sequela Active Problems:   Nausea & vomiting   Type 2 diabetes mellitus with chronic kidney disease, without long-term current use of insulin (HCC)   Essential hypertension   Hyperlipidemia, unspecified   Leukocytosis   Postoperative anemia  Estimated body mass index is 24.37 kg/m as calculated from the following:   Height as of this encounter: 5' 6 (1.676 m).   Weight as of this encounter: 68.5 kg. Advance diet Up with therapy D/C IV fluids when tolerating po intake.  Labs and vitals reviewed this AM.  WBC down to 10.3, Hg 9.2. Up with PT.  Due to family support and steps at home current plan is for d/c to SNF pending her progress with PT. Patient has had a BM.  Following discharge, follow-up with [facility] in 10-14 days for staple removal and x-rays of the right femur. Continue Lovenox 40mg  daily for 14 days for DVT prophylaxis.  DVT Prophylaxis - Lovenox and TED hose Weight-Bearing as tolerated to right leg  J. Gustavo Level, PA-C [facility] Orthopaedic Surgery 03/04/2024, 7:51 AM

## 2024-03-04 NOTE — Progress Notes (Signed)
 Physical Therapy Treatment Patient Details Name: Monica Nixon MRN: [id number] DOB: August 15, 1956 Today's Date: 03/04/2024   History of Present Illness Patient is a 68 year old female with fall resulting in displaced intertrochanteric fracture right hip, s/p IM nail. History of HTN, hypercholesterolemia, diabetes, and SVT    PT Comments  Patient alert, agreeable to PT, seated EOB with tech on PT entrance. Just finished ambulating and using commode in room. After a seated rest break she was able to stand with CGA and RW, good carryover of education on RLE positioning/hand placement noted. Close chair follow for ambulating, able to ambulate ~49ft with CGA prior to needing a seated rest break. Pt complained of feeling hot, sweaty, and mildly nauseous, may benefit from orthostatic assessment, CNA notified. Pt continued to demonstrate limitations in activity tolerance and endurance, would benefit from further skilled PT intervention to maximize function.     If plan is discharge home, recommend the following: A little help with walking and/or transfers;A little help with bathing/dressing/bathroom;Help with stairs or ramp for entrance;Assistance with cooking/housework   Can travel by private vehicle     Yes  Equipment Recommendations  BSC/3in1    Recommendations for Other Services       Precautions / Restrictions Precautions Precautions: Fall Recall of Precautions/Restrictions: Intact Restrictions Weight Bearing Restrictions Per Provider Order: Yes RLE Weight Bearing Per Provider Order: Weight bearing as tolerated     Mobility  Bed Mobility               General bed mobility comments: pt sitting EOB with tech upon PT arrival    Transfers Overall transfer level: Needs assistance Equipment used: Rolling walker (2 wheels) Transfers: Sit to/from Stand Sit to Stand: Contact guard assist           General transfer comment: noted for good carryover of hand placement and RLE  positioning    Ambulation/Gait Ambulation/Gait assistance: Contact guard assist Gait Distance (Feet):  (40ft, 4ft) Assistive device: Rolling walker (2 wheels) Gait Pattern/deviations: Decreased step length - right, Decreased stride length, Antalgic Gait velocity: decreased     General Gait Details: pt fatigued quickly. complained of being hot, sweaty, mildly nauseous. may benefit from orthostatic assessment   Stairs             Wheelchair Mobility     Tilt Bed    Modified Rankin (Stroke Patients Only)       Balance Overall balance assessment: Needs assistance Sitting-balance support: Feet supported Sitting balance-Leahy Scale: Good     Standing balance support: Bilateral upper extremity supported Standing balance-Leahy Scale: Fair                              Communication    Cognition Arousal: Alert Behavior During Therapy: WFL for tasks assessed/performed   PT - Cognitive impairments: No apparent impairments                                Cueing    Exercises      General Comments        Pertinent Vitals/Pain Pain Assessment Pain Assessment: 0-10 Pain Score: 3  Pain Location: R hip Pain Descriptors / Indicators: Discomfort, Sore Pain Intervention(s): Limited activity within patient's tolerance, Monitored during session, Premedicated before session, Repositioned    Home Living  Prior Function            PT Goals (current goals can now be found in the care plan section) Progress towards PT goals: Progressing toward goals    Frequency    7X/week      PT Plan      Co-evaluation              AM-PAC PT 6 Clicks Mobility   Outcome Measure  Help needed turning from your back to your side while in a flat bed without using bedrails?: A Little Help needed moving from lying on your back to sitting on the side of a flat bed without using bedrails?: A Little Help needed  moving to and from a bed to a chair (including a wheelchair)?: A Little Help needed standing up from a chair using your arms (e.g., wheelchair or bedside chair)?: A Little Help needed to walk in hospital room?: A Little Help needed climbing 3-5 steps with a railing? : A Little 6 Click Score: 18    End of Session Equipment Utilized During Treatment: Gait belt Activity Tolerance: Patient tolerated treatment well Patient left: in chair;with call bell/phone within reach;with chair alarm set Nurse Communication: Mobility status (potential need for orthostatic vitals) PT Visit Diagnosis: Difficulty in walking, not elsewhere classified (R26.2);Other abnormalities of gait and mobility (R26.89)     Time: 9145-9086 PT Time Calculation (min) (ACUTE ONLY): 19 min  Charges:    $Therapeutic Activity: 8-22 mins PT General Charges $$ ACUTE PT VISIT: 1 Visit                     Doyal Shams PT, DPT 10:13 AM,03/04/24

## 2024-03-04 NOTE — Progress Notes (Signed)
  Progress Note   Patient: Monica Nixon FMW:[id number] DOB: 1956/07/12 DOA: 03/02/2024     2 DOS: the patient was seen and examined on 03/04/2024   Brief hospital course: 68 y.o. female with medical history significant of type 2 diabetes mellitus hypertension hyperlipidemia and prior stroke.  She presents to the hospital after going up steps on the desk she lost her balance and had a fall on her hip.  Came in for further evaluation.  In the ER she was found to have a displaced right hip fracture.  Hospitalist service was contacted for further evaluation.  Dr. Edie  took to the operating room and performed a right reduction and internal fixation of displaced intertrochanteric right hip fracture with Biomet FXS TFN nail. Patient doing well since surgery, pending nursing home placement.   Principal Problem:   Closed right hip fracture, sequela Active Problems:   Nausea & vomiting   Type 2 diabetes mellitus with chronic kidney disease, without long-term current use of insulin (HCC)   Essential hypertension   Hyperlipidemia, unspecified   Leukocytosis   Postoperative anemia   CKD stage 3a, GFR 45-59 ml/min (HCC)   Acute blood loss anemia   Assessment and Plan: * Closed right hip fracture, sequela Acute blood loss anemia. Postoperative day 1 for reduction internal fixation of displaced right intertrochanteric fracture with Biomet FXS TFN nail.   Patient doing well since surgery, pain control well. Hemoglobin dropped down to 9.2, will continue to follow, no need for transfusion now.   Nausea & vomiting Resolved.  Type 2 diabetes mellitus with chronic kidney disease, without long-term current use of insulin (HCC) CKD stage IIIa.   Continue sliding scale.  Essential hypertension Continue Norvasc, Cozaar and Toprol  Hyperlipidemia, unspecified Continue Lipitor       Subjective:  Patient doing well today, pain under control.  No nausea vomiting.  Physical Exam: Vitals:    03/03/24 2334 03/04/24 0258 03/04/24 0751 03/04/24 1517  BP: (!) 150/59 (!) 166/68 (!) 164/67 (!) 153/59  Pulse: 88 75 89 79  Resp: 18 20 20 20   Temp: 98.6 F (37 C) 98.3 F (36.8 C) 98.2 F (36.8 C) 98.7 F (37.1 C)  TempSrc: Oral Oral  Oral  SpO2: 98% 100% 99% 100%  Weight:      Height:       General exam: Appears calm and comfortable  Respiratory system: Clear to auscultation. Respiratory effort normal. Cardiovascular system: S1 & S2 heard, RRR. No JVD, murmurs, rubs, gallops or clicks. No pedal edema. Gastrointestinal system: Abdomen is nondistended, soft and nontender. No organomegaly or masses felt. Normal bowel sounds heard. Central nervous system: Alert and oriented. No focal neurological deficits. Extremities: Symmetric 5 x 5 power. Skin: No rashes, lesions or ulcers Psychiatry: Judgement and insight appear normal. Mood & affect appropriate.    Data Reviewed:  Lab results reviewed.  Family Communication: Boyfriend updated at bedside.  Disposition: Status is: Inpatient Remains inpatient appropriate because: Severity of disease, postop     Time spent: 35 minutes  Author: Murvin Mana, MD 03/04/2024 4:10 PM  For on call review [email or web address].

## 2024-03-04 NOTE — NC FL2 (Signed)
 [place]  MEDICAID FL2 LEVEL OF CARE FORM     IDENTIFICATION  Patient Name: Monica Nixon Birthdate: 08-27-1956 Sex: female Admission Date (Current Location): 03/02/2024  [facility] and [place] Number:  Chiropodist and Address:  [facility], [address] [place], [place] [address]      Provider Number: [id number]  Attending Physician Name and Address:  Laurita Pillion, MD  Relative Name and Phone Number:  Spouse: Brittnie Lewey, [phone]    Current Level of Care: Hospital Recommended Level of Care: Skilled Nursing Facility Prior Approval Number:    Date Approved/Denied:   PASRR Number: [id number]  Discharge Plan: SNF    Current Diagnoses: Patient Active Problem List   Diagnosis Date Noted   CKD stage 3a, GFR 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/min (HCC) 03/04/2024   Acute blood loss anemia 03/04/2024   Leukocytosis 03/03/2024   Postoperative anemia 03/03/2024   Closed right hip fracture, sequela 03/02/2024   Nausea & vomiting 03/02/2024   Type 2 diabetes mellitus with chronic kidney disease, without long-term current use of insulin (HCC) 03/02/2024   Essential hypertension 03/02/2024   Hyperlipidemia, unspecified 03/02/2024    Orientation RESPIRATION BLADDER Height & Weight     Self, Time, Situation, Place  Normal Continent Weight: 68.5 kg Height:  5' 6 (167.6 cm)  BEHAVIORAL SYMPTOMS/MOOD NEUROLOGICAL BOWEL NUTRITION STATUS      Continent Diet (Heart healthy)  AMBULATORY STATUS COMMUNICATION OF NEEDS Skin   Extensive Assist Verbally Surgical wounds                       Personal Care Assistance Level of Assistance  Bathing, Feeding, Dressing Bathing Assistance: Limited assistance Feeding assistance: Independent Dressing Assistance: Limited assistance     Functional Limitations Info             SPECIAL CARE FACTORS FREQUENCY  PT (By licensed PT), OT (By licensed OT)     PT Frequency: 5 times per week OT Frequency: 5 times per  week            Contractures Contractures Info: Not present    Additional Factors Info  Code Status, Allergies Code Status Info: Full Code Allergies Info: Codeine           Current Medications (03/04/2024):  This is the current hospital active medication list Current Facility-Administered Medications  Medication Dose Route Frequency Provider Last Rate Last Admin   acetaminophen  (TYLENOL ) tablet 1,000 mg  1,000 mg Oral Q6H Kip Lynwood Double, PA-C   1,000 mg at 03/04/24 0823   amLODipine (NORVASC) tablet 5 mg  5 mg Oral Daily Poggi, John J, MD   5 mg at 03/04/24 9177   atorvastatin (LIPITOR) tablet 10 mg  10 mg Oral Daily Poggi, John J, MD   10 mg at 03/04/24 0824   bisacodyl (DULCOLAX) suppository 10 mg  10 mg Rectal Daily PRN Poggi, John J, MD       Chlorhexidine Gluconate Cloth 2 % PADS 6 each  6 each Topical Q0600 Poggi, Norleen PARAS, MD   6 each at 03/03/24 0534   diphenhydrAMINE (BENADRYL) 12.5 MG/5ML elixir 12.5-25 mg  12.5-25 mg Oral Q4H PRN Poggi, John J, MD       docusate sodium (COLACE) capsule 100 mg  100 mg Oral BID Poggi, John J, MD   100 mg at 03/04/24 0823   enoxaparin (LOVENOX) injection 40 mg  40 mg Subcutaneous Q24H Poggi, John J, MD   40 mg at 03/04/24 [phone]  famotidine (PEPCID) tablet 20 mg  20 mg Oral Daily Wieting, Richard, MD   20 mg at 03/04/24 0824   insulin aspart (novoLOG) injection 0-5 Units  0-5 Units Subcutaneous QHS Poggi, Norleen PARAS, MD       insulin aspart (novoLOG) injection 0-6 Units  0-6 Units Subcutaneous TID WC Poggi, John J, MD       losartan (COZAAR) tablet 100 mg  100 mg Oral Daily Poggi, John J, MD   100 mg at 03/04/24 9175   magnesium hydroxide (MILK OF MAGNESIA) suspension 30 mL  30 mL Oral Daily PRN Poggi, John J, MD       metoCLOPramide (REGLAN) tablet 5-10 mg  5-10 mg Oral Q8H PRN Poggi, John J, MD       Or   metoCLOPramide (REGLAN) injection 5-10 mg  5-10 mg Intravenous Q8H PRN Poggi, John J, MD       metoprolol succinate (TOPROL-XL) 24 hr  tablet 25 mg  25 mg Oral Daily Poggi, John J, MD   25 mg at 03/04/24 0824   mupirocin ointment (BACTROBAN) 2 % 1 Application  1 Application Nasal BID Poggi, Norleen PARAS, MD   1 Application at 03/04/24 9177   ondansetron  (ZOFRAN ) tablet 4 mg  4 mg Oral Q6H PRN Poggi, John J, MD   4 mg at 03/03/24 0845   Or   ondansetron  (ZOFRAN ) injection 4 mg  4 mg Intravenous Q6H PRN Poggi, John J, MD   4 mg at 03/02/24 1951   oxyCODONE  (Oxy IR/ROXICODONE ) immediate release tablet 5-10 mg  5-10 mg Oral Q4H PRN Josette Ade, MD       polyethylene glycol (MIRALAX / GLYCOLAX) packet 17 g  17 g Oral Daily Josette Ade, MD   17 g at 03/03/24 1257   sodium phosphate (FLEET) enema 1 enema  1 enema Rectal Once PRN Poggi, John J, MD       tiZANidine (ZANAFLEX) tablet 4 mg  4 mg Oral Q8H PRN Poggi, John J, MD         Discharge Medications: Please see discharge summary for a list of discharge medications.  Relevant Imaging Results:  Relevant Lab Results:   Additional Information SSN: [id number]  Quintella Suzen Jansky, RN

## 2024-03-04 NOTE — TOC Progression Note (Signed)
 Transition of Care [facility]) - Progression Note    Patient Details  Name: Monica Nixon MRN: [id number] Date of Birth: [birth year]  Transition [facility]) CM/SW Contact  Quintella Suzen Jansky, RN Phone Number: 03/04/2024, 2:59 PM  Clinical Narrative:     Spoke with patient regarding her choices, [facility] offered her bed. She stated this is her top choice. Selected facility via HUB.        Expected Discharge Plan and Services                                               Social Determinants of Health (SDOH) Interventions SDOH Screenings   Food Insecurity: No Food Insecurity (03/02/2024)  Housing: Low Risk  (03/02/2024)  Transportation Needs: No Transportation Needs (03/02/2024)  Utilities: Not At Risk (03/02/2024)  Financial Resource Strain: Low Risk  (08/13/2023)   Received from [facility]  Social Connections: Unknown (03/02/2024)  Tobacco Use: Medium Risk (03/02/2024)    Readmission Risk Interventions     No data to display

## 2024-03-05 DIAGNOSIS — N1831 Chronic kidney disease, stage 3a: Secondary | ICD-10-CM | POA: Diagnosis not present

## 2024-03-05 DIAGNOSIS — S72001S Fracture of unspecified part of neck of right femur, sequela: Secondary | ICD-10-CM | POA: Diagnosis not present

## 2024-03-05 DIAGNOSIS — D62 Acute posthemorrhagic anemia: Secondary | ICD-10-CM | POA: Diagnosis not present

## 2024-03-05 LAB — GLUCOSE, CAPILLARY: Glucose-Capillary: 116 mg/dL — ABNORMAL HIGH (ref 70–99)

## 2024-03-05 MED ORDER — POLYETHYLENE GLYCOL 3350 17 G PO PACK: 17.0000 g | PACK | Freq: Every day | ORAL | Status: AC | PRN

## 2024-03-05 MED ORDER — ACETAMINOPHEN 500 MG PO TABS: 1000.0000 mg | ORAL_TABLET | Freq: Four times a day (QID) | ORAL | 0 refills | Status: AC

## 2024-03-05 MED ORDER — ENOXAPARIN SODIUM 40 MG/0.4ML IJ SOSY: 40.0000 mg | PREFILLED_SYRINGE | INTRAMUSCULAR | 0 refills | Status: AC

## 2024-03-05 MED ORDER — ONDANSETRON HCL 4 MG PO TABS: 4.0000 mg | ORAL_TABLET | Freq: Four times a day (QID) | ORAL | 0 refills | Status: AC | PRN

## 2024-03-05 MED ORDER — OXYCODONE HCL 5 MG PO TABS: 5.0000 mg | ORAL_TABLET | Freq: Four times a day (QID) | ORAL | 0 refills | Status: DC | PRN

## 2024-03-05 NOTE — TOC Transition Note (Signed)
 Transition of Care [facility]) - Discharge Note   Patient Details  Name: Monica Nixon MRN: [id number] Date of Birth: [birth year]  Transition of Care [facility]) CM/SW Contact:  Quintella Suzen Jansky, RN Phone Number: 03/05/2024, 10:47 AM   Clinical Narrative:     Patient to discharge today to short term rehab. She chose a bed at [facility]. Discharge summary and orders sent to facility via the HUB. Patient's spouse, Camellia, will transport her to the facility via private vehicle. She is going to room 115, nurse to call report [phone]. Patient would like to eat lunch prior to discharge, will go to facility around 2:00pm. Notified bedside nurse.  Final next level of care: Skilled Nursing Facility Barriers to Discharge: Barriers Resolved   Patient Goals and CMS Choice Patient states their goals for this hospitalization and ongoing recovery are:: get better CMS Medicare.gov Compare Post Acute Care list provided to:: Patient Choice offered to / list presented to : Patient      Discharge Placement              Patient chooses bed at: [facility] Patient to be transferred to facility by: Spouse - Camellia Name of family member notified: Camellia Patient and family notified of of transfer: 03/05/24  Discharge Plan and Services Additional resources added to the After Visit Summary for                    DME Agency: NA       HH Arranged: NA          Social Drivers of Health (SDOH) Interventions SDOH Screenings   Food Insecurity: No Food Insecurity (03/02/2024)  Housing: Low Risk  (03/02/2024)  Transportation Needs: No Transportation Needs (03/02/2024)  Utilities: Not At Risk (03/02/2024)  Financial Resource Strain: Low Risk  (08/13/2023)   Received from [facility]  Social Connections: Unknown (03/02/2024)  Tobacco Use: Medium Risk (03/02/2024)     Readmission Risk Interventions     No data to display

## 2024-03-05 NOTE — Progress Notes (Signed)
 Subjective: 3 Days Post-Op Procedure(s) (LRB): FIXATION, FRACTURE, INTERTROCHANTERIC, WITH INTRAMEDULLARY ROD (Right) Patient reports pain as mild in the right hip.  Patient is well, and has had no acute complaints or problems Plan is for d/c to [facility] Negative for chest pain and shortness of breath Fever: no Gastrointestinal:Negative for vomiting.  Nausea has improved. Patient has had a BM.  Objective: Vital signs in last 24 hours: Temp:  [98 F (36.7 C)-98.7 F (37.1 C)] 98 F (36.7 C) (06/26 0426) Pulse Rate:  [79-89] 87 (06/26 0426) Resp:  [12-20] 16 (06/26 0426) BP: (153-171)/(59-70) 171/70 (06/26 0426) SpO2:  [97 %-100 %] 99 % (06/26 0426)  Intake/Output from previous day:  Intake/Output Summary (Last 24 hours) at 03/05/2024 0731 Last data filed at 03/04/2024 1455 Gross per 24 hour  Intake 240 ml  Output --  Net 240 ml    Intake/Output this shift: No intake/output data recorded.  Labs: Recent Labs    03/02/24 1132 03/03/24 0503 03/04/24 0450  HGB 12.7 10.5* 9.2*   Recent Labs    03/03/24 0503 03/04/24 0450  WBC 15.9* 10.3  RBC 3.47* 3.07*  HCT 31.0* 27.8*  PLT 221 181   Recent Labs    03/03/24 0503 03/04/24 0450  NA 138 140  K 4.1 4.5  CL 109 111  CO2 23 23  BUN 22 31*  CREATININE 1.23* 1.51*  GLUCOSE 170* 80  CALCIUM 8.1* 8.6*   No results for input(s): LABPT, INR in the last 72 hours.   EXAM General - Patient is Alert, Appropriate, and Oriented Extremity - ABD soft Neurovascular intact Dorsiflexion/Plantar flexion intact Incision: dressing C/D/I No cellulitis present Compartment soft Dressing/Incision - clean, dry, no drainage to the right hip honeycomb dressings. Motor Function - intact, moving foot and toes well on exam.  Abdomen soft with intact bowel sounds this AM.  Past Medical History:  Diagnosis Date   Diabetes mellitus without complication (HCC)    Hypercholesteremia    Hypertension    Stroke (HCC)    SVT  (supraventricular tachycardia) (HCC)     Assessment/Plan: 3 Days Post-Op Procedure(s) (LRB): FIXATION, FRACTURE, INTERTROCHANTERIC, WITH INTRAMEDULLARY ROD (Right) Principal Problem:   Closed right hip fracture, sequela Active Problems:   Nausea & vomiting   Type 2 diabetes mellitus with chronic kidney disease, without long-term current use of insulin (HCC)   Essential hypertension   Hyperlipidemia, unspecified   Leukocytosis   Postoperative anemia   CKD stage 3a, GFR 45-59 ml/min (HCC)   Acute blood loss anemia  Estimated body mass index is 24.37 kg/m as calculated from the following:   Height as of this encounter: 5' 6 (1.676 m).   Weight as of this encounter: 68.5 kg. Advance diet Up with therapy D/C IV fluids when tolerating po intake.  Vitals reviewed this AM. Plan is for d/c to [facility]. Patient has had a BM.  Following discharge, follow-up with [facility] in 10-14 days for staple removal and x-rays of the right femur.  If she is still at [facility] in two weeks staples can be removed by SNF on 03/16/24 and follow-up with 6 weeks for x-rays. Continue Lovenox 40mg  daily for 14 days for DVT prophylaxis.  DVT Prophylaxis - Lovenox and TED hose Weight-Bearing as tolerated to right leg  J. Gustavo Level, PA-C [facility] Orthopaedic Surgery 03/05/2024, 7:31 AM

## 2024-03-05 NOTE — Discharge Summary (Signed)
 Physician Discharge Summary   Patient: Monica Nixon MRN: [id number] DOB: Jan 15, 1956  Admit date:     03/02/2024  Discharge date: 03/05/24  Discharge Physician: Murvin Mana   PCP: Auston Reyes BIRCH, MD   Recommendations at discharge:   Follow-up with PCP in 1 week. Follow-up with orthopedics as scheduled.  Discharge Diagnoses: Principal Problem:   Closed right hip fracture, sequela Active Problems:   Nausea & vomiting   Type 2 diabetes mellitus with chronic kidney disease, without long-term current use of insulin (HCC)   Essential hypertension   Hyperlipidemia, unspecified   Leukocytosis   Postoperative anemia   CKD stage 3a, GFR 45-59 ml/min (HCC)   Acute blood loss anemia  Resolved Problems:   * No resolved hospital problems. *  Hospital Course: 68 y.o. female with medical history significant of type 2 diabetes mellitus hypertension hyperlipidemia and prior stroke.  She presents to the hospital after going up steps on the desk she lost her balance and had a fall on her hip.  Came in for further evaluation.  In the ER she was found to have a displaced right hip fracture.  Hospitalist service was contacted for further evaluation.  Dr. Edie  took to the operating room and performed a right reduction and internal fixation of displaced intertrochanteric right hip fracture with Biomet FXS TFN nail. Patient doing well since surgery, transfer patient to nursing home for rehab.  Assessment and Plan: * Closed right hip fracture, sequela Acute blood loss anemia. Postoperative day 1 for reduction internal fixation of displaced right intertrochanteric fracture with Biomet FXS TFN nail.   Patient doing well since surgery, pain control well. Hemoglobin has been stable, patient currently medically stable for discharge.     Nausea & vomiting Resolved.   Type 2 diabetes mellitus with chronic kidney disease, without long-term current use of insulin (HCC) CKD stage IIIa.   Resume home  treatment.   Essential hypertension Resume home treatment.   Hyperlipidemia, unspecified Continue Lipitor         Consultants: Orthopedics Procedures performed: Hip  Disposition: Skilled nursing facility Diet recommendation:  Discharge Diet Orders (From admission, onward)     Start     Ordered   03/05/24 0000  Diet - low sodium heart healthy        03/05/24 1006           Cardiac diet DISCHARGE MEDICATION: Allergies as of 03/05/2024       Reactions   Codeine Nausea And Vomiting   sensitive Pt is able to take as long as she eats prior to taking.        Medication List     STOP taking these medications    ibuprofen 200 MG tablet Commonly known as: ADVIL       TAKE these medications    acetaminophen  500 MG tablet Commonly known as: TYLENOL  Take 2 tablets (1,000 mg total) by mouth every 6 (six) hours.   amLODipine 5 MG tablet Commonly known as: NORVASC Take by mouth.   aspirin 81 MG chewable tablet Chew by mouth daily.   atorvastatin 10 MG tablet Commonly known as: LIPITOR Take 10 mg by mouth daily.   D 1000 25 MCG (1000 UT) capsule Generic drug: Cholecalciferol Take 1,000 Units by mouth daily.   enoxaparin 40 MG/0.4ML injection Commonly known as: LOVENOX Inject 0.4 mLs (40 mg total) into the skin daily.   fluticasone 50 MCG/ACT nasal spray Commonly known as: FLONASE Place 1 spray into the nose  daily as needed for allergies or rhinitis.   glimepiride 4 MG tablet Commonly known as: AMARYL Take 4 mg by mouth 2 (two) times daily.   losartan 100 MG tablet Commonly known as: COZAAR Take 100 mg by mouth daily.   metoprolol succinate 25 MG 24 hr tablet Commonly known as: TOPROL-XL Take 25 mg by mouth daily.   ondansetron  4 MG tablet Commonly known as: ZOFRAN  Take 1 tablet (4 mg total) by mouth every 6 (six) hours as needed for nausea.   oxyCODONE  5 MG immediate release tablet Commonly known as: Oxy IR/ROXICODONE  Take 1 tablet (5 mg  total) by mouth every 6 (six) hours as needed for breakthrough pain.   polyethylene glycol 17 g packet Commonly known as: MIRALAX / GLYCOLAX Take 17 g by mouth daily as needed.   Semaglutide (1 MG/DOSE) 4 MG/3ML Sopn Inject 1 mg into the skin once a week.   tiZANidine 4 MG tablet Commonly known as: ZANAFLEX Take 4 mg by mouth every 8 (eight) hours as needed for muscle spasms.               Discharge Care Instructions  (From admission, onward)           Start     Ordered   03/05/24 0000  Discharge wound care:       Comments: Follow by RN and orthopedics   03/05/24 1006            Contact information for follow-up providers     Kip Lynwood Double, PA-C Follow up in 14 day(s).   Specialty: Physician Assistant Why: Staples and x-rays. If still at [facility], they can remove staples on 03/16/24 and follow-up with [facility] in 6 weeks. Contact information: [address] [place] [address] [phone]         Auston Reyes BIRCH, MD Follow up in 1 week(s).   Specialty: Internal Medicine Contact information: [address] [place] [address] [phone]              Contact information for after-discharge care     Destination     [facility] .   Service: Skilled Nursing Contact information: [address] [place]  [address] [phone]                    Discharge Exam: Fredricka Weights   03/02/24 1504  Weight: 68.5 kg   General exam: Appears calm and comfortable  Respiratory system: Clear to auscultation. Respiratory effort normal. Cardiovascular system: S1 & S2 heard, RRR. No JVD, murmurs, rubs, gallops or clicks. No pedal edema. Gastrointestinal system: Abdomen is nondistended, soft and nontender. No organomegaly or masses felt. Normal bowel sounds heard. Central nervous system: Alert and oriented. No focal neurological deficits. Extremities: Symmetric 5 x 5 power. Skin:  No rashes, lesions or ulcers Psychiatry: Judgement and insight appear normal. Mood & affect appropriate.    Condition at discharge: good  The results of significant diagnostics from this hospitalization (including imaging, microbiology, ancillary and laboratory) are listed below for reference.   Imaging Studies: DG HIP UNILAT WITH PELVIS 2-3 VIEWS RIGHT Result Date: 03/02/2024 CLINICAL DATA:  Elective surgery. EXAM: DG HIP (WITH OR WITHOUT PELVIS) 2-3V RIGHT COMPARISON:  Radiograph earlier today FINDINGS: Four fluoroscopic spot views of the right hip and femur submitted from the operating room. Femoral intramedullary nail with trans trochanteric and distal locking screw fixation traverse proximal femur fracture. Fluoroscopy time 1 minutes  22 seconds. Dose 13.55 mGy IMPRESSION: Intraoperative fluoroscopy during proximal femur fracture fixation. Electronically Signed   By: Andrea Gasman M.D.   On: 03/02/2024 18:52   DG C-Arm 1-60 Min-No Report Result Date: 03/02/2024 Fluoroscopy was utilized by the requesting physician.  No radiographic interpretation.   DG C-Arm 1-60 Min-No Report Result Date: 03/02/2024 Fluoroscopy was utilized by the requesting physician.  No radiographic interpretation.   DG Chest 1 View Result Date: 03/02/2024 CLINICAL DATA:  Right hip pain post fall EXAM: CHEST  1 VIEW COMPARISON:  None Available. FINDINGS: Lungs are clear. Heart size and mediastinal contours are within normal limits. Aortic Atherosclerosis (ICD10-170.0). No effusion. Vertebral endplate spurring at multiple levels in the lower thoracic spine. IMPRESSION: No acute cardiopulmonary disease. Electronically Signed   By: JONETTA Faes M.D.   On: 03/02/2024 11:16   DG Hip Unilat W or Wo Pelvis 2-3 Views Right Result Date: 03/02/2024 CLINICAL DATA:  Pain post fall EXAM: DG HIP (WITH OR WITHOUT PELVIS) 2-3V RIGHT COMPARISON:  None Available. FINDINGS: Displaced oblique minimally comminuted intertrochanteric fracture  of the right femur, with 3.6 cm medial displacement and some override of distal fracture fragment. No dislocation. Bony pelvis intact. Mild spondylitic changes in the visualized lower lumbar spine. IMPRESSION: Displaced intertrochanteric fracture of the right femur. Electronically Signed   By: JONETTA Faes M.D.   On: 03/02/2024 11:16    Microbiology: Results for orders placed or performed during the hospital encounter of 03/02/24  Surgical PCR screen     Status: None   Collection Time: 03/02/24  1:55 PM   Specimen: Nasal Mucosa; Nasal Swab  Result Value Ref Range Status   MRSA, PCR NEGATIVE NEGATIVE Final   Staphylococcus aureus NEGATIVE NEGATIVE Final    Comment: (NOTE) The Xpert SA Assay (FDA approved for NASAL specimens in patients 22 years of age and older), is one component of a comprehensive surveillance program. It is not intended to diagnose infection nor to guide or monitor treatment. Performed at [facility], [address] [place], [place] [address]     Labs: CBC: Recent Labs  Lab 03/02/24 1132 03/03/24 0503 03/04/24 0450  WBC 12.0* 15.9* 10.3  NEUTROABS 10.1*  --   --   HGB 12.7 10.5* 9.2*  HCT 37.8 31.0* 27.8*  MCV 89.2 89.3 90.6  PLT 225 221 181   Basic Metabolic Panel: Recent Labs  Lab 03/02/24 1132 03/03/24 0503 03/04/24 0450  NA 137 138 140  K 4.0 4.1 4.5  CL 106 109 111  CO2 26 23 23   GLUCOSE 191* 170* 80  BUN 18 22 31*  CREATININE 1.19* 1.23* 1.51*  CALCIUM 8.9 8.1* 8.6*   Liver Function Tests: Recent Labs  Lab 03/02/24 1132  AST 31  ALT 29  ALKPHOS 49  BILITOT 1.0  PROT 6.6  ALBUMIN 3.8   CBG: Recent Labs  Lab 03/04/24 0751 03/04/24 1159 03/04/24 1646 03/04/24 2005 03/05/24 0724  GLUCAP 89 102* 105* 114* 116*    Discharge time spent:  32 minutes.  Signed: Murvin Mana, MD [facility] 03/05/2024

## 2024-03-05 NOTE — Discharge Instructions (Signed)
 Diet: As you were doing prior to hospitalization   Shower:  May shower but keep the wounds dry, use an occlusive plastic wrap, NO SOAKING IN TUB.  If the bandage gets wet, change with a clean dry gauze.  Dressing:  You may change your dressing as needed. Change the dressing with sterile gauze dressing.    Activity:  Increase activity slowly as tolerated, but follow the weight bearing instructions below.  No lifting or driving for 6 weeks.  Weight Bearing:   Weight bearing as tolerated to right lower extremity  To prevent constipation: you may use a stool softener such as -  Colace (over the counter) 100 mg by mouth twice a day  Drink plenty of fluids (prune juice may be helpful) and high fiber foods Miralax (over the counter) for constipation as needed.    Itching:  If you experience itching with your medications, try taking only a single pain pill, or even half a pain pill at a time.  You may take up to 10 pain pills per day, and you can also use benadryl over the counter for itching or also to help with sleep.   Precautions:  If you experience chest pain or shortness of breath - call 911 immediately for transfer to the hospital emergency department!!  If you develop a fever greater that 101 F, purulent drainage from wound, increased redness or drainage from wound, or calf pain-Call [facility]                                              Follow- Up Appointment:  Please call for an appointment to be seen in 2 weeks at [facility] if you are home from SNF.  If you are still at [facility] they can removed the staples on 03/16/24 and follow-up with me at [facility] in 6 weeks.

## 2024-03-06 ENCOUNTER — Encounter: Payer: Self-pay | Admitting: Student

## 2024-03-06 ENCOUNTER — Non-Acute Institutional Stay (SKILLED_NURSING_FACILITY): Payer: Self-pay | Admitting: Student

## 2024-03-06 DIAGNOSIS — N1831 Chronic kidney disease, stage 3a: Secondary | ICD-10-CM | POA: Diagnosis not present

## 2024-03-06 DIAGNOSIS — D62 Acute posthemorrhagic anemia: Secondary | ICD-10-CM

## 2024-03-06 DIAGNOSIS — S72001S Fracture of unspecified part of neck of right femur, sequela: Secondary | ICD-10-CM | POA: Diagnosis not present

## 2024-03-06 DIAGNOSIS — Z8673 Personal history of transient ischemic attack (TIA), and cerebral infarction without residual deficits: Secondary | ICD-10-CM

## 2024-03-06 DIAGNOSIS — E1122 Type 2 diabetes mellitus with diabetic chronic kidney disease: Secondary | ICD-10-CM

## 2024-03-06 DIAGNOSIS — I10 Essential (primary) hypertension: Secondary | ICD-10-CM

## 2024-03-06 MED ORDER — OXYCODONE HCL 5 MG PO TABS: 5.0000 mg | ORAL_TABLET | Freq: Four times a day (QID) | ORAL | 0 refills | Status: AC | PRN

## 2024-03-06 NOTE — Progress Notes (Addendum)
 Provider:  Dr. Richerd Brigham Location:  Other [place].  Nursing Home Room Number: [facility]. [facility] [id number] Place of Service:  SNF (31)  PCP: Auston Reyes BIRCH, MD Patient Care Team: Auston Reyes BIRCH, MD as PCP - General (Internal Medicine)  Extended Emergency Contact Information Primary Emergency Contact: [facility] Address: [address]          [place], [place] [address] [place] Home Phone: [phone] Work Phone: [phone] Mobile Phone: [phone] Relation: Spouse Secondary Emergency Contact: Ahlgren,Bekka Mobile Phone: [phone] Relation: Daughter  Code Status: Full Code Goals of Care: Advanced Directive information    03/02/2024    2:58 PM  Advanced Directives  Does Patient Have a Medical Advance Directive? No  Would patient like information on creating a medical advance directive? No - Patient declined     Chief Complaint  Patient presents with   New Admit To SNF    Admission.     HPI: Patient is a 68 y.o. Nixon seen today for admission to [facility] SN[facility] History of Present Illness The patient is a 68 year old with a history of stroke and osteoporosis who presents with a recent fall resulting in a hip fracture.  She fell on her deck while going upstairs, resulting in a hip fracture. She lost balance and was unable to grab the rail, landing on her hip. She was alone for 10-15 minutes before being found by her granddaughter. She was wearing flip flops at the time of the fall but insists they were not the cause.  She has a history of a stroke in 2008, which occasionally causes her to get off balance, though she states this is rare. She also has a history of a knee fracture last year, which occurred at ground level due to tripping over a sidewalk grate. She was wearing flip flops during that incident as well.  Post-surgery, she experienced nausea and vomiting, which she attributes to sensitivity to pain medications. She  cannot tolerate fentanyl  or Dilaudid  but found relief with Toradol  without nausea. She has not taken Toradol  since Tuesday and is currently managing pain with Tylenol , reporting pain levels of zero to two when moving.  She has a history of osteoporosis and was previously on Fosamax, which she is no longer taking. She takes vitamin D. She also takes amlodipine  for blood pressure, aspirin due to her stroke history, atorvastatin , tizanidine  for sleep and back pain, and Ozempic for blood sugar control. She reports losing 35 pounds since starting Ozempic and has managed to maintain low blood sugar levels.  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 She previously worked in Audiological scientist, at 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  Results LABS A1c: 5.6 (02/2024) A1c: 5.9 (11/2023) A1c: 9.0 (05/2023) A1c: 13.0 (12/2022)   Past Medical History:  Diagnosis Date   Diabetes mellitus without complication (HCC)    Hypercholesteremia    Hypertension    Stroke [facility])    SVT (supraventricular tachycardia) (HCC)    Past Surgical History:  Procedure Laterality Date   CHOLECYSTECTOMY     COLONOSCOPY WITH PROPOFOL  N/A 03/31/2018   Procedure: COLONOSCOPY WITH PROPOFOL ;  Surgeon: Viktoria Lamar DASEN, MD;  Location: [facility] ENDOSCOPY;  Service: Endoscopy;  Laterality: N/A;   INTRAMEDULLARY (IM) NAIL INTERTROCHANTERIC Right 03/02/2024   Procedure: FIXATION, FRACTURE, INTERTROCHANTERIC, WITH INTRAMEDULLARY ROD;  Surgeon: Edie Norleen PARAS, MD;  Location: [facility] ORS;  Service: Orthopedics;  Laterality: Right;   TRIGGER FINGER RELEASE      reports that she has quit smoking. She has never used smokeless tobacco. She reports current alcohol use. She reports that she does not use drugs. Social History   Socioeconomic History   Marital status: Married    Spouse name: Not on file   Number of  children: Not on file   Years of education: Not on file   Highest education level: Not on file  Occupational History   Not on file  Tobacco Use   Smoking status: Former   Smokeless tobacco: Never  Substance and Sexual Activity   Alcohol use: Yes   Drug use: Never   Sexual activity: Not on file  Other Topics Concern   Not on file  Social History Narrative   Not on file   Social Drivers of Health   Financial Resource Strain: Low Risk  (08/13/2023)   Received from [facility]   Overall Financial Resource Strain (CARDIA)    Difficulty of Paying Living Expenses: Not hard at all  Food Insecurity: No Food Insecurity (03/02/2024)   Hunger Vital Sign    Worried About Running Out of Food in the Last Year: Never true    Ran Out of Food in the Last Year: Never true  Transportation Needs: No Transportation Needs (03/02/2024)   PRAPARE - Administrator, Civil Service (Medical): No    Lack of Transportation (Non-Medical): No  Physical Activity: Not on file  Stress: Not on file  Social Connections: Unknown (03/02/2024)   Social Connection and Isolation Panel    Frequency of Communication with Friends and Family: Not on file    Frequency of Social Gatherings with Friends and Family: Not on file    Attends Religious Services: Not on file    Active Member of Clubs or Organizations: Yes    Attends Banker Meetings: Not on file    Marital Status: Not on file  Intimate Partner Violence: Not At Risk (03/02/2024)   Humiliation, Afraid, Rape, and Kick questionnaire    Fear of Current or Ex-Partner: No    Emotionally Abused: No    Physically Abused: No    Sexually Abused: No    Functional Status Survey:    Family History  Problem Relation Age of Onset   Hypertension Mother    Diabetes Mother    Hypercholesterolemia Mother    Heart failure Father    Breast cancer Neg Hx     Health Maintenance  Topic Date Due   Medicare Annual Wellness (AWV)   Never done   FOOT EXAM  Never done   OPHTHALMOLOGY EXAM  Never done   Diabetic kidney evaluation - Urine ACR  Never done   Hepatitis C Screening  Never done   Pneumococcal Vaccine: 50+ Years (1 of 2 - PCV) Never done   Zoster Vaccines- Shingrix (1 of 2) Never done   Cervical Cancer Screening (HPV/Pap Cotest)  Never done   COVID-19 Vaccine (3 - Pfizer risk series) 01/29/2020   DEXA SCAN  Never done   INFLUENZA VACCINE  04/10/2024   HEMOGLOBIN A1C  09/01/2024   Diabetic kidney evaluation - eGFR measurement  03/04/2025   MAMMOGRAM  11/14/2025   DTaP/Tdap/Td (2 - Td or Tdap) 02/13/2033   Colonoscopy  05/29/2033   HIV Screening  Completed   Hepatitis B Vaccines  Aged Out   HPV VACCINES  Aged Out  Meningococcal B Vaccine  Aged Out    Allergies  Allergen Reactions   Codeine Nausea And Vomiting    sensitive Pt is able to take as long as she eats prior to taking.    Outpatient Encounter Medications as of 03/06/2024  Medication Sig   acetaminophen  (TYLENOL ) 500 MG tablet Take 2 tablets (1,000 mg total) by mouth every 6 (six) hours.   amLODipine  (NORVASC ) 5 MG tablet Take by mouth.   aspirin 81 MG chewable tablet Chew by mouth daily.   atorvastatin  (LIPITOR) 10 MG tablet Take 10 mg by mouth daily.   Cholecalciferol (D 1000) 25 MCG (1000 UT) capsule Take 1,000 Units by mouth daily.   enoxaparin  (LOVENOX ) 40 MG/0.4ML injection Inject 0.4 mLs (40 mg total) into the skin daily.   fluticasone (FLONASE) 50 MCG/ACT nasal spray Place 1 spray into the nose daily as needed for allergies or rhinitis.   glimepiride (AMARYL) 4 MG tablet Take 4 mg by mouth 2 (two) times daily.   losartan  (COZAAR ) 100 MG tablet Take 100 mg by mouth daily.   metoprolol  succinate (TOPROL -XL) 25 MG 24 hr tablet Take 25 mg by mouth daily.   ondansetron  (ZOFRAN ) 4 MG tablet Take 1 tablet (4 mg total) by mouth every 6 (six) hours as needed for nausea.   oxyCODONE  (OXY IR/ROXICODONE ) 5 MG immediate release tablet Take 1  tablet (5 mg total) by mouth every 6 (six) hours as needed for breakthrough pain.   polyethylene glycol (MIRALAX  / GLYCOLAX ) 17 g packet Take 17 g by mouth daily as needed.   tiZANidine  (ZANAFLEX ) 4 MG tablet Take 4 mg by mouth every 8 (eight) hours as needed for muscle spasms.   Semaglutide, 1 MG/DOSE, 4 MG/3ML SOPN Inject 1 mg into the skin once a week. (Patient not taking: Reported on 03/06/2024)   No facility-administered encounter medications on file as of 03/06/2024.    Review of Systems  Vitals:   03/06/24 1002  BP: 122/71  Pulse: (!) 103  Resp: 18  Temp: 97.8 F (36.6 C)  SpO2: 98%  Weight: 151 lb 12.8 oz (68.9 kg)  Height: 5' 6 (1.676 m)   Body mass index is 24.5 kg/m. Physical Exam Constitutional:      Appearance: Normal appearance.   Cardiovascular:     Rate and Rhythm: Normal rate and regular rhythm.     Pulses: Normal pulses.     Heart sounds: Normal heart sounds.  Pulmonary:     Effort: Pulmonary effort is normal.  Abdominal:     General: Abdomen is flat. Bowel sounds are normal.     Palpations: Abdomen is soft.   Musculoskeletal:        General: No swelling or tenderness.   Skin:    General: Skin is warm and dry.   Neurological:     Mental Status: She is alert and oriented to person, place, and time.     Gait: Gait normal.   Psychiatric:        Mood and Affect: Mood normal.     Labs reviewed: Basic Metabolic Panel: Recent Labs    03/02/24 1132 03/03/24 0503 03/04/24 0450  NA 137 138 140  K 4.0 4.1 4.5  CL 106 109 111  CO2 26 23 23   GLUCOSE 191* 170* 80  BUN 18 22 31*  CREATININE 1.19* 1.23* 1.51*  CALCIUM  8.9 8.1* 8.6*   Liver Function Tests: Recent Labs    03/02/24 1132  AST 31  ALT 29  ALKPHOS  49  BILITOT 1.0  PROT 6.6  ALBUMIN 3.8   No results for input(s): LIPASE, AMYLASE in the last 8760 hours. No results for input(s): AMMONIA in the last 8760 hours. CBC: Recent Labs    03/02/24 1132 03/03/24 0503  03/04/24 0450  WBC 12.0* 15.9* 10.3  NEUTROABS 10.1*  --   --   HGB 12.7 10.5* 9.2*  HCT 37.8 31.0* 27.8*  MCV 89.2 89.3 90.6  PLT 225 221 181   Cardiac Enzymes: No results for input(s): CKTOTAL, CKMB, CKMBINDEX, TROPONINI in the last 8760 hours. BNP: Invalid input(s): POCBNP Lab Results  Component Value Date   HGBA1C 5.6 03/02/2024   No results found for: TSH No results found for: VITAMINB12 No results found for: FOLATE No results found for: IRON, TIBC, FERRITIN  Imaging and Procedures obtained prior to SNF admission: DG HIP UNILAT WITH PELVIS 2-3 VIEWS RIGHT Result Date: 03/02/2024 CLINICAL DATA:  Elective surgery. EXAM: DG HIP (WITH OR WITHOUT PELVIS) 2-3V RIGHT COMPARISON:  Radiograph earlier today FINDINGS: Four fluoroscopic spot views of the right hip and femur submitted from the operating room. Femoral intramedullary nail with trans trochanteric and distal locking screw fixation traverse proximal femur fracture. Fluoroscopy time 1 minutes 22 seconds. Dose 13.55 mGy IMPRESSION: Intraoperative fluoroscopy during proximal femur fracture fixation. Electronically Signed   By: Andrea Gasman M.D.   On: 03/02/2024 18:52   DG C-Arm 1-60 Min-No Report Result Date: 03/02/2024 Fluoroscopy was utilized by the requesting physician.  No radiographic interpretation.   DG C-Arm 1-60 Min-No Report Result Date: 03/02/2024 Fluoroscopy was utilized by the requesting physician.  No radiographic interpretation.   DG Chest 1 View Result Date: 03/02/2024 CLINICAL DATA:  Right hip pain post fall EXAM: CHEST  1 VIEW COMPARISON:  None Available. FINDINGS: Lungs are clear. Heart size and mediastinal contours are within normal limits. Aortic Atherosclerosis (ICD10-170.0). No effusion. Vertebral endplate spurring at multiple levels in the lower thoracic spine. IMPRESSION: No acute cardiopulmonary disease. Electronically Signed   By: JONETTA Faes M.D.   On: 03/02/2024 11:16   DG Hip  Unilat W or Wo Pelvis 2-3 Views Right Result Date: 03/02/2024 CLINICAL DATA:  Pain post fall EXAM: DG HIP (WITH OR WITHOUT PELVIS) 2-3V RIGHT COMPARISON:  None Available. FINDINGS: Displaced oblique minimally comminuted intertrochanteric fracture of the right femur, with 3.6 cm medial displacement and some override of distal fracture fragment. No dislocation. Bony pelvis intact. Mild spondylitic changes in the visualized lower lumbar spine. IMPRESSION: Displaced intertrochanteric fracture of the right femur. Electronically Signed   By: JONETTA Faes M.D.   On: 03/02/2024 11:16    Assessment/Plan Hip fracture Recent hip fracture due to a fall DOI 03/02/24, with a history of a previous knee fracture last year. Post-surgical recovery includes nausea and vomiting post-operatively, with sensitivity to fentanyl  and Dilaudid . Toradol  was effective without causing nausea. Currently managing pain with Tylenol , reporting minimal pain levels. - Continue Tylenol  for pain management - Encourage gradual increase in mobility as tolerated  Osteoporosis Osteoporosis with recent fractures indicating a need to restart treatment to prevent future fractures. Previously completed a typical five-year course of Fosamax. - Restart Fosamax to prevent future fractures - Ensure adequate intake of calcium  and vitamin D  Type 2 diabetes mellitus Type 2 diabetes with significant improvement in A1c from 13.0 to 5.6. Currently on Ozempic, contributing to weight loss and improved blood sugar control. Considering discontinuation of glimepiride due to low A1c and risk of hypoglycemia. - Continue Ozempic for blood  sugar control - Discontinue glimepiride due to low A1c and risk of hypoglycemia  Hypertension Hypertension managed with amlodipine . Blood pressure is well-controlled. - Continue amlodipine  for blood pressure management  Hyperlipidemia Hyperlipidemia managed with atorvastatin . Current dosage is low, but cholesterol levels  are well-managed. - Continue atorvastatin  for cholesterol management - Review dosage and adjust if necessary  History of stroke Stroke in 2008 with no paralysis or drooping, but required relearning to sing. Currently on aspirin for stroke prevention. Low dose atorvastatin  noted, but cholesterol levels are well-managed. - Continue aspirin for stroke prevention - Review atorvastatin  dosage and adjust if necessary   Family/ staff Communication: nursing  Labs/tests ordered: CBC, BMP

## 2024-03-07 ENCOUNTER — Encounter: Payer: Self-pay | Admitting: Student

## 2024-03-07 DIAGNOSIS — Z8679 Personal history of other diseases of the circulatory system: Secondary | ICD-10-CM | POA: Insufficient documentation

## 2024-03-16 ENCOUNTER — Encounter: Payer: Self-pay | Admitting: Adult Health

## 2024-03-16 ENCOUNTER — Non-Acute Institutional Stay (SKILLED_NURSING_FACILITY): Payer: Self-pay | Admitting: Adult Health

## 2024-03-16 DIAGNOSIS — M810 Age-related osteoporosis without current pathological fracture: Secondary | ICD-10-CM

## 2024-03-16 DIAGNOSIS — I10 Essential (primary) hypertension: Secondary | ICD-10-CM | POA: Diagnosis not present

## 2024-03-16 DIAGNOSIS — S72001S Fracture of unspecified part of neck of right femur, sequela: Secondary | ICD-10-CM | POA: Diagnosis not present

## 2024-03-16 DIAGNOSIS — E785 Hyperlipidemia, unspecified: Secondary | ICD-10-CM | POA: Diagnosis not present

## 2024-03-16 NOTE — Progress Notes (Signed)
 Location:  Other ([facility] Room Number: 115 A Place of Service:  SNF [phone]) Provider:  Medina-Vargas, Herchel Hopkin, DNP, FNP-BC  Patient Care Team: Auston Reyes BIRCH, MD as PCP - General (Internal Medicine)  Extended Emergency Contact Information Primary Emergency Contact: Elbaum,ERIC Address: [address]          [place], [place] [address] [place] Home Phone: [phone] Work Phone: [phone] Mobile Phone: [phone] Relation: Spouse Secondary Emergency Contact: Ahlgren,Bekka Mobile Phone: [phone] Relation: Daughter  Code Status:   Full Code  Goals of care: Advanced Directive information    03/02/2024    2:58 PM  Advanced Directives  Does Patient Have a Medical Advance Directive? No  Would patient like information on creating a medical advance directive? No - Patient declined     Chief Complaint  Patient presents with   Discharge Visit    For discharge home on 03/17/24    HPI:  Pt is a 68 y.o. female seen today for discharge home on 03/17/24. She  will discharge with outpatient PT and OT at [facility].   She was hospitalized 03/02/24 to 03/05/24. She fell on her right hip after tripping on her stairs. EMS brought her to ED and imaging showed displaced intertrochanteric fracture of the right femur. Orthopedic was consulted. She had reduction and internal fixation of right hip on 03/02/24  She completed short-term rehabilitation at [facility].   Past Medical History:  Diagnosis Date   Diabetes mellitus without complication (HCC)    Hypercholesteremia    Hypertension    Stroke [facility])    SVT (supraventricular tachycardia) (HCC)    Past Surgical History:  Procedure Laterality Date   CHOLECYSTECTOMY     COLONOSCOPY WITH PROPOFOL  N/A 03/31/2018   Procedure: COLONOSCOPY WITH PROPOFOL ;  Surgeon: Viktoria Lamar DASEN, MD;  Location: [facility] ENDOSCOPY;  Service: Endoscopy;  Laterality: N/A;   INTRAMEDULLARY (IM)  NAIL INTERTROCHANTERIC Right 03/02/2024   Procedure: FIXATION, FRACTURE, INTERTROCHANTERIC, WITH INTRAMEDULLARY ROD;  Surgeon: Edie Norleen PARAS, MD;  Location: [facility] ORS;  Service: Orthopedics;  Laterality: Right;   TRIGGER FINGER RELEASE      Allergies  Allergen Reactions   Codeine Nausea And Vomiting    sensitive Pt is able to take as long as she eats prior to taking.    Outpatient Encounter Medications as of 03/16/2024  Medication Sig   acetaminophen  (TYLENOL ) 500 MG tablet Take 2 tablets (1,000 mg total) by mouth every 6 (six) hours.   amLODipine  (NORVASC ) 5 MG tablet Take by mouth.   aspirin 81 MG chewable tablet Chew by mouth daily.   atorvastatin  (LIPITOR) 10 MG tablet Take 10 mg by mouth daily.   Cholecalciferol (D 1000) 25 MCG (1000 UT) capsule Take 1,000 Units by mouth daily.   enoxaparin  (LOVENOX ) 40 MG/0.4ML injection Inject 0.4 mLs (40 mg total) into the skin daily.   fluticasone (FLONASE) 50 MCG/ACT nasal spray Place 1 spray into the nose daily as needed for allergies or rhinitis.   losartan  (COZAAR ) 100 MG tablet Take 100 mg by mouth daily.   metoprolol  succinate (TOPROL -XL) 25 MG 24 hr tablet Take 25 mg by mouth daily.   ondansetron  (ZOFRAN ) 4 MG tablet Take 1 tablet (4 mg total) by mouth every 6 (six) hours as needed for nausea.   oxyCODONE  (OXY IR/ROXICODONE ) 5 MG immediate release tablet Take 1 tablet (5 mg total) by mouth every 6 (six) hours as needed for breakthrough pain.   polyethylene  glycol (MIRALAX  / GLYCOLAX ) 17 g packet Take 17 g by mouth daily as needed.   Semaglutide, 1 MG/DOSE, 4 MG/3ML SOPN Inject 1 mg into the skin once a week. (Patient not taking: Reported on 03/06/2024)   tiZANidine  (ZANAFLEX ) 4 MG tablet Take 4 mg by mouth every 8 (eight) hours as needed for muscle spasms.   No facility-administered encounter medications on file as of 03/16/2024.    Review of Systems  Constitutional:  Negative for appetite change, chills, fatigue and fever.  HENT:  Negative  for congestion, hearing loss, rhinorrhea and sore throat.   Eyes: Negative.   Respiratory:  Negative for cough, shortness of breath and wheezing.   Cardiovascular:  Negative for chest pain, palpitations and leg swelling.  Gastrointestinal:  Negative for abdominal pain, constipation, diarrhea, nausea and vomiting.  Genitourinary:  Negative for dysuria.  Musculoskeletal:  Negative for arthralgias, back pain and myalgias.  Skin:  Negative for color change, rash and wound.  Neurological:  Negative for dizziness, weakness and headaches.  Psychiatric/Behavioral:  Negative for behavioral problems. The patient is not nervous/anxious.       Immunization History  Administered Date(s) Administered   Influenza Inj Mdck Quad Pf 07/29/2018, 06/09/2019   Influenza, Mdck, Trivalent,PF 6+ MOS(egg free) 08/28/2023   Influenza,inj,Quad PF,6+ Mos 06/08/2020   Influenza-Unspecified 08/22/2017   PFIZER Comirnaty(Gray Top)Covid-19 Tri-Sucrose Vaccine 12/14/2019, 01/01/2020   Tdap 02/14/2023   Pertinent  Health Maintenance Due  Topic Date Due   FOOT EXAM  Never done   OPHTHALMOLOGY EXAM  Never done   DEXA SCAN  Never done   INFLUENZA VACCINE  04/10/2024   HEMOGLOBIN A1C  09/01/2024   MAMMOGRAM  11/14/2025   Colonoscopy  05/29/2033       No data to display           Vitals:   03/16/24 1731  BP: 131/67  Pulse: 81  Resp: 16  Temp: (!) 97.4 F (36.3 C)  SpO2: 97%  Weight: 153 lb 6.4 oz (69.6 kg)  Height: 5' 4 (1.626 m)   Body mass index is 26.33 kg/m.  Physical Exam Constitutional:      Appearance: Normal appearance.  HENT:     Head: Normocephalic and atraumatic.     Nose: Nose normal.     Mouth/Throat:     Mouth: Mucous membranes are moist.  Eyes:     Conjunctiva/sclera: Conjunctivae normal.  Cardiovascular:     Rate and Rhythm: Normal rate and regular rhythm.  Pulmonary:     Effort: Pulmonary effort is normal.     Breath sounds: Normal breath sounds.  Abdominal:      General: Bowel sounds are normal.     Palpations: Abdomen is soft.  Musculoskeletal:        General: Normal range of motion.     Cervical back: Normal range of motion.  Skin:    General: Skin is warm and dry.     Comments: Right hip surgical wound X 3, dry, no redness  Neurological:     General: No focal deficit present.     Mental Status: She is alert and oriented to person, place, and time.  Psychiatric:        Mood and Affect: Mood normal.        Behavior: Behavior normal.        Thought Content: Thought content normal.        Judgment: Judgment normal.        Labs reviewed: Recent Labs  03/02/24 1132 03/03/24 0503 03/04/24 0450  NA 137 138 140  K 4.0 4.1 4.5  CL 106 109 111  CO2 26 23 23   GLUCOSE 191* 170* 80  BUN 18 22 31*  CREATININE 1.19* 1.23* 1.51*  CALCIUM  8.9 8.1* 8.6*   Recent Labs    03/02/24 1132  AST 31  ALT 29  ALKPHOS 49  BILITOT 1.0  PROT 6.6  ALBUMIN 3.8   Recent Labs    03/02/24 1132 03/03/24 0503 03/04/24 0450  WBC 12.0* 15.9* 10.3  NEUTROABS 10.1*  --   --   HGB 12.7 10.5* 9.2*  HCT 37.8 31.0* 27.8*  MCV 89.2 89.3 90.6  PLT 225 221 181   No results found for: TSH Lab Results  Component Value Date   HGBA1C 5.6 03/02/2024   No results found for: CHOL, HDL, LDLCALC, LDLDIRECT, TRIG, CHOLHDL  Significant Diagnostic Results in last 30 days:  DG HIP UNILAT WITH PELVIS 2-3 VIEWS RIGHT Result Date: 03/02/2024 CLINICAL DATA:  Elective surgery. EXAM: DG HIP (WITH OR WITHOUT PELVIS) 2-3V RIGHT COMPARISON:  Radiograph earlier today FINDINGS: Four fluoroscopic spot views of the right hip and femur submitted from the operating room. Femoral intramedullary nail with trans trochanteric and distal locking screw fixation traverse proximal femur fracture. Fluoroscopy time 1 minutes 22 seconds. Dose 13.55 mGy IMPRESSION: Intraoperative fluoroscopy during proximal femur fracture fixation. Electronically Signed   By: Andrea Gasman  M.D.   On: 03/02/2024 18:52   DG C-Arm 1-60 Min-No Report Result Date: 03/02/2024 Fluoroscopy was utilized by the requesting physician.  No radiographic interpretation.   DG C-Arm 1-60 Min-No Report Result Date: 03/02/2024 Fluoroscopy was utilized by the requesting physician.  No radiographic interpretation.   DG Chest 1 View Result Date: 03/02/2024 CLINICAL DATA:  Right hip pain post fall EXAM: CHEST  1 VIEW COMPARISON:  None Available. FINDINGS: Lungs are clear. Heart size and mediastinal contours are within normal limits. Aortic Atherosclerosis (ICD10-170.0). No effusion. Vertebral endplate spurring at multiple levels in the lower thoracic spine. IMPRESSION: No acute cardiopulmonary disease. Electronically Signed   By: JONETTA Faes M.D.   On: 03/02/2024 11:16   DG Hip Unilat W or Wo Pelvis 2-3 Views Right Result Date: 03/02/2024 CLINICAL DATA:  Pain post fall EXAM: DG HIP (WITH OR WITHOUT PELVIS) 2-3V RIGHT COMPARISON:  None Available. FINDINGS: Displaced oblique minimally comminuted intertrochanteric fracture of the right femur, with 3.6 cm medial displacement and some override of distal fracture fragment. No dislocation. Bony pelvis intact. Mild spondylitic changes in the visualized lower lumbar spine. IMPRESSION: Displaced intertrochanteric fracture of the right femur. Electronically Signed   By: JONETTA Faes M.D.   On: 03/02/2024 11:16    Assessment/Plan  1. Closed right hip fracture, sequela (Primary) -  S/P  reduction and internal fixation of right hip on 03/02/24 -  completed in-house short-term rehabilitation -  will now discharge home on 03/17/24 with outpatient PT and OT at [facility].  -  does not want prescriptions to be sent to her pharmacy -  follow up with orthopedics  2. Essential hypertension -  BP stable -  continue losartan  100 mg daily -   Continue metoprolol  ER 25 mg daily - Continue amlodipine  5 mg daily     3. Hyperlipidemia, unspecified hyperlipidemia  type -   Continue atorvastatin  10 mg daily  4. Age related osteoporosis, unspecified pathological fracture presence -   Continue Fosamax 70 mg weekly     I have filled out  patient's discharge paperwork. Patient will have outpatient PTand OT with [facility]  DME provided: None  Total discharge time: Greater than 30 minutes  Discharge time involved coordination of the discharge process with social  worker and nursing .    Akita Maxim Medina-Vargas, DNP, MSN, FNP-BC [facility] [phone] (Monday-Friday 8:00 a.m. - 5:00 p.m.) [phone] (after hours)
